# Patient Record
Sex: Female | Born: 1980 | Hispanic: No | Smoking: Never smoker
Health system: Southern US, Community
[De-identification: ages and names within clinical notes are randomized; demographics above are authoritative.]

## PROBLEM LIST (undated history)

## (undated) DIAGNOSIS — R002 Palpitations: Secondary | ICD-10-CM

## (undated) HISTORY — PX: WISDOM TOOTH EXTRACTION: SHX21

## (undated) HISTORY — DX: Palpitations: R00.2

## (undated) HISTORY — PX: MOUTH SURGERY: SHX715

---

## 2002-10-15 ENCOUNTER — Other Ambulatory Visit: Admission: RE | Admit: 2002-10-15 | Discharge: 2002-10-15 | Payer: Self-pay | Admitting: Obstetrics and Gynecology

## 2004-02-04 ENCOUNTER — Other Ambulatory Visit: Admission: RE | Admit: 2004-02-04 | Discharge: 2004-02-04 | Payer: Self-pay | Admitting: Obstetrics and Gynecology

## 2010-09-14 ENCOUNTER — Emergency Department (HOSPITAL_BASED_OUTPATIENT_CLINIC_OR_DEPARTMENT_OTHER)
Admission: EM | Admit: 2010-09-14 | Discharge: 2010-09-14 | Disposition: A | Discharge status: Home / Self Care | Payer: PRIVATE HEALTH INSURANCE | Attending: Emergency Medicine | Admitting: Emergency Medicine

## 2010-09-14 ENCOUNTER — Emergency Department (INDEPENDENT_AMBULATORY_CARE_PROVIDER_SITE_OTHER): Payer: PRIVATE HEALTH INSURANCE

## 2010-09-14 DIAGNOSIS — W19XXXA Unspecified fall, initial encounter: Secondary | ICD-10-CM | POA: Insufficient documentation

## 2010-09-14 DIAGNOSIS — R110 Nausea: Secondary | ICD-10-CM

## 2010-09-14 DIAGNOSIS — R51 Headache: Secondary | ICD-10-CM

## 2010-09-14 DIAGNOSIS — F1010 Alcohol abuse, uncomplicated: Secondary | ICD-10-CM | POA: Insufficient documentation

## 2010-09-14 DIAGNOSIS — R55 Syncope and collapse: Secondary | ICD-10-CM

## 2010-09-14 DIAGNOSIS — Y92009 Unspecified place in unspecified non-institutional (private) residence as the place of occurrence of the external cause: Secondary | ICD-10-CM | POA: Insufficient documentation

## 2010-09-14 DIAGNOSIS — S060X9A Concussion with loss of consciousness of unspecified duration, initial encounter: Secondary | ICD-10-CM | POA: Insufficient documentation

## 2010-09-14 LAB — DIFFERENTIAL
Basophils Absolute: 0 10*3/uL (ref 0.0–0.1)
Eosinophils Absolute: 0.2 10*3/uL (ref 0.0–0.7)
Eosinophils Relative: 5 % (ref 0–5)
Lymphocytes Relative: 36 % (ref 12–46)
Lymphs Abs: 1.7 10*3/uL (ref 0.7–4.0)
Monocytes Absolute: 0.4 10*3/uL (ref 0.1–1.0)

## 2010-09-14 LAB — PREGNANCY, URINE: Preg Test, Ur: NEGATIVE

## 2010-09-14 LAB — BASIC METABOLIC PANEL
BUN: 8 mg/dL (ref 6–23)
Calcium: 9.1 mg/dL (ref 8.4–10.5)
Creatinine, Ser: 0.8 mg/dL (ref 0.4–1.2)
GFR calc non Af Amer: >60 mL/min (ref >60)
Glucose, Bld: 83 mg/dL (ref 70–99)
Potassium: 4.4 mEq/L (ref 3.5–5.1)

## 2010-09-14 LAB — CBC
HCT: 35.4 % — ABNORMAL LOW (ref 36.0–46.0)
MCHC: 34.5 g/dL (ref 30.0–36.0)
MCV: 92.2 fL (ref 78.0–100.0)
Platelets: 261 10*3/uL (ref 150–400)
RDW: 12.1 % (ref 11.5–15.5)
WBC: 4.7 10*3/uL (ref 4.0–10.5)

## 2011-01-12 DIAGNOSIS — J45909 Unspecified asthma, uncomplicated: Secondary | ICD-10-CM | POA: Insufficient documentation

## 2012-04-12 IMAGING — CT CT HEAD W/O CM
1 series · 16 of 30 positions shown, 20 images · non-contrast
Comparison: 

CLINICAL DATA: Syncopal episode resulting in a fall and hitting
her head 2 days ago in the left frontal region.  Nausea.
Photosensitivity.  Headache.

CT HEAD WITHOUT CONTRAST
TECHNIQUE: Contiguous axial images were obtained from the base of
the skull through the vertex without contrast.

[Series 2: head 4.8 h37s · axial · 0.44mm/px · z∈[+1168,+1301]mm · 16 of 32 slices shown, 20 images]
[im 2/32  brain]
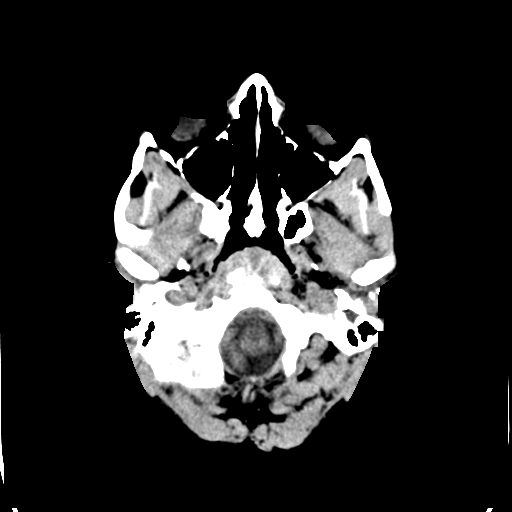
[im 2/32  bone]
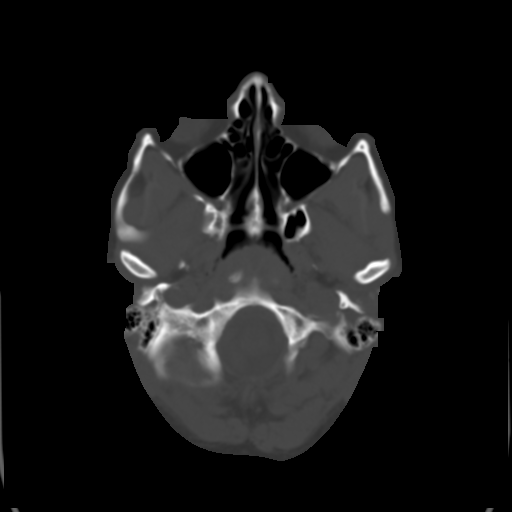
[im 4/32  brain]
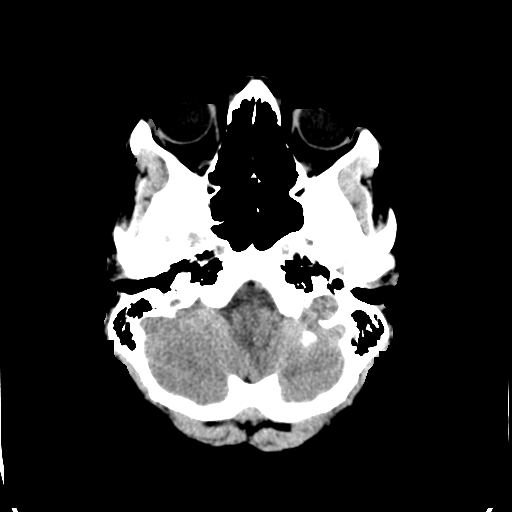
[im 6/32  brain]
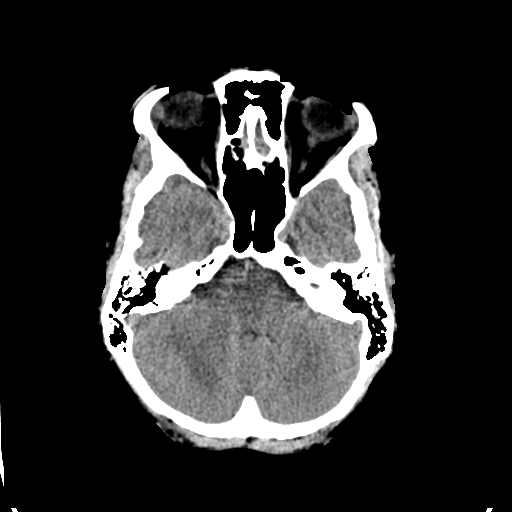
[im 8/32  brain]
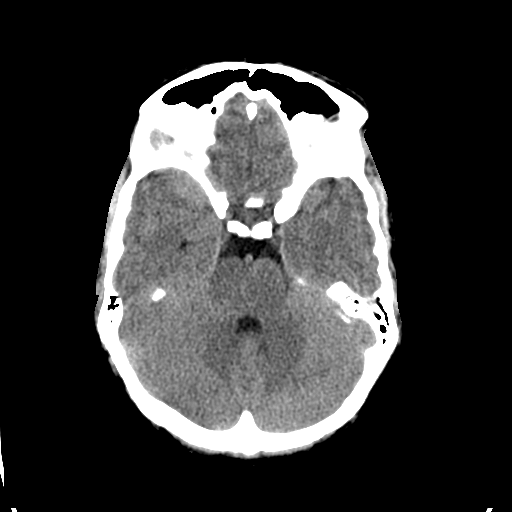
[im 9/32  brain]
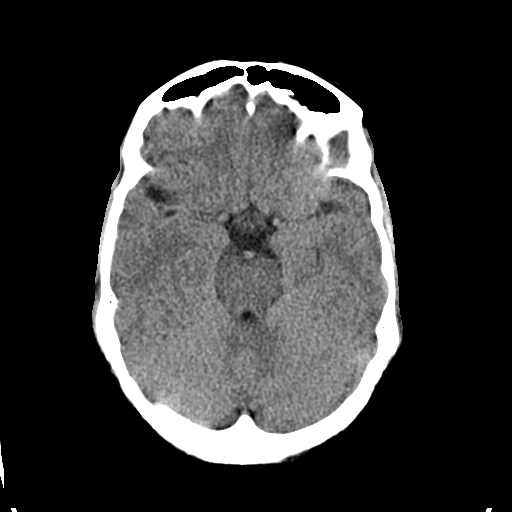
[im 9/32  bone]
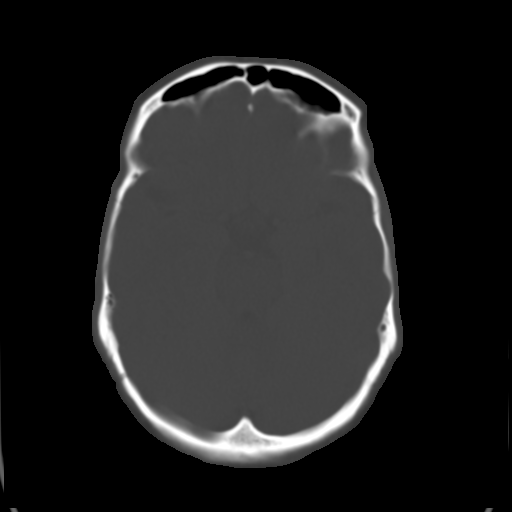
[im 11/32  brain]
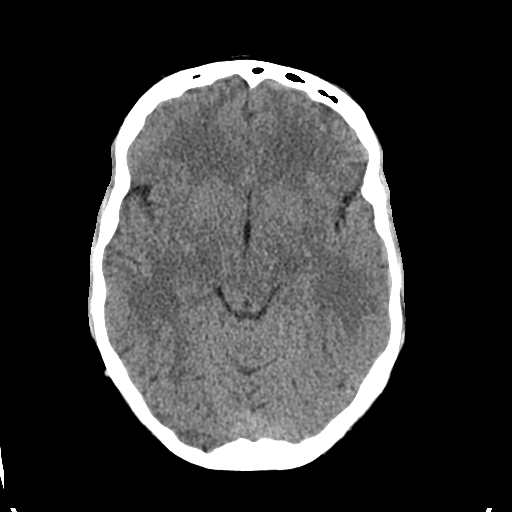
[im 13/32  brain]
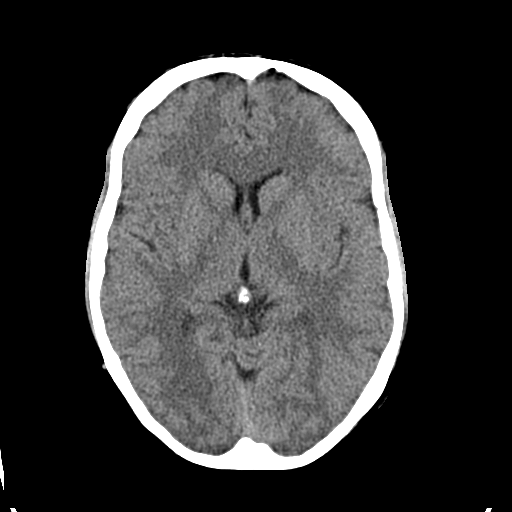
[im 15/32  brain]
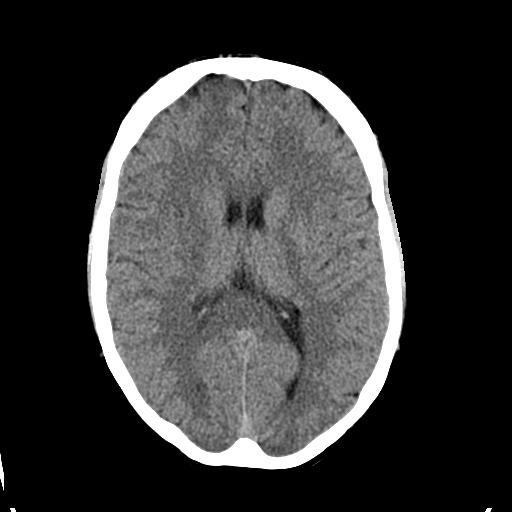
[im 17/32  brain]
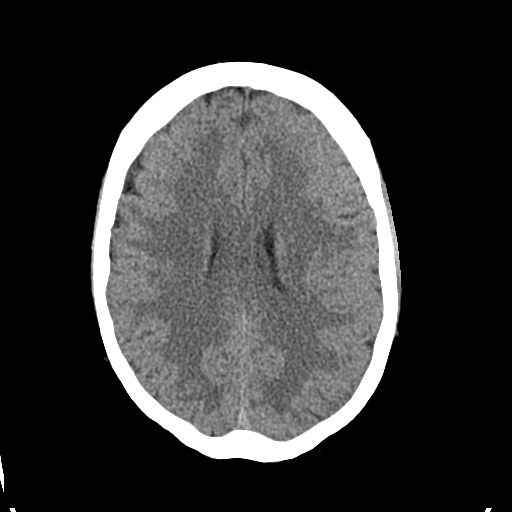
[im 17/32  bone]
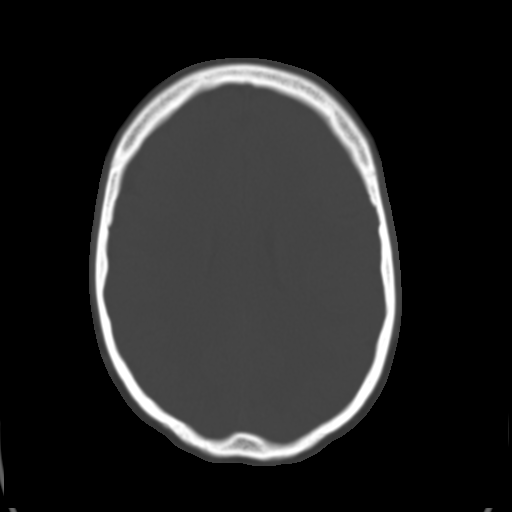
[im 19/32  brain]
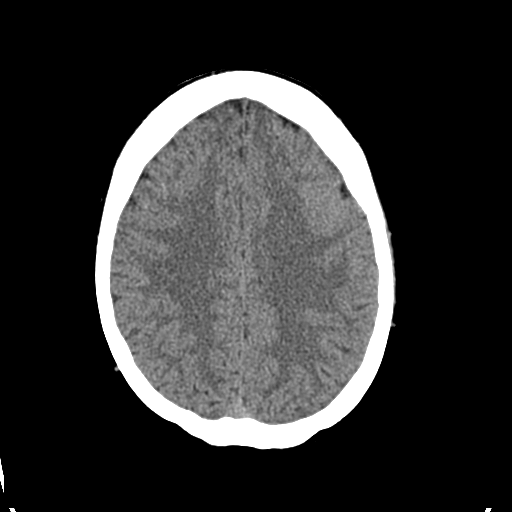
[im 21/32  brain]
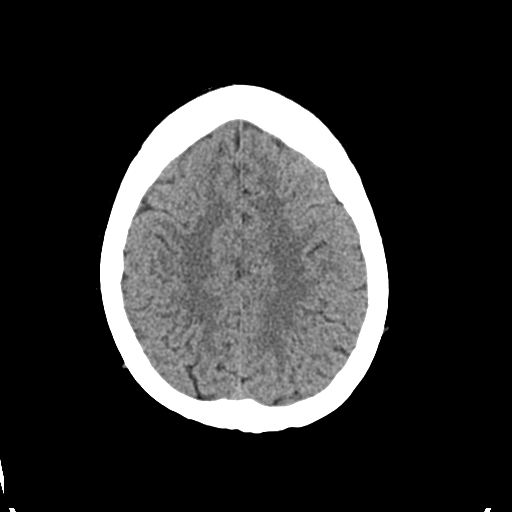
[im 23/32  brain]
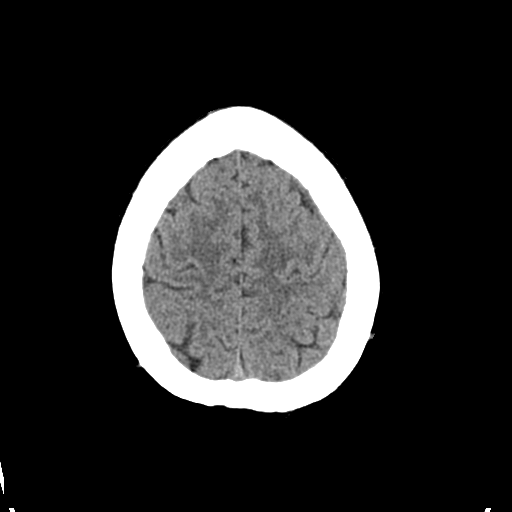
[im 24/32  brain]
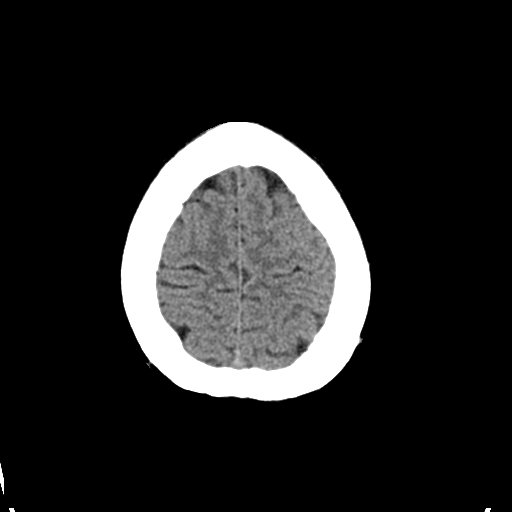
[im 24/32  bone]
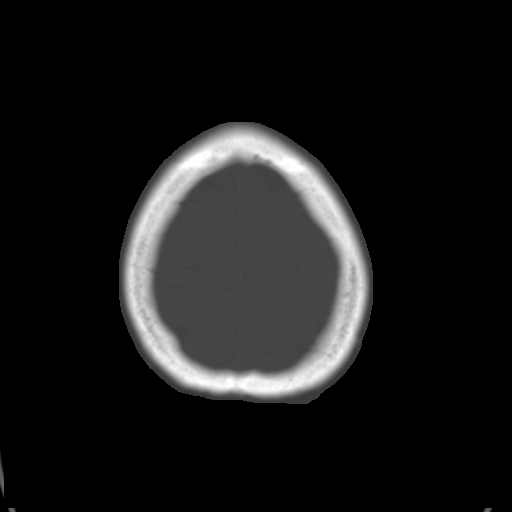
[im 26/32  brain]
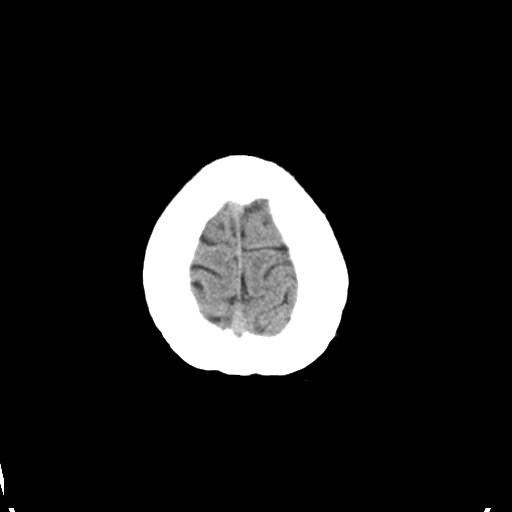
[im 28/32  brain]
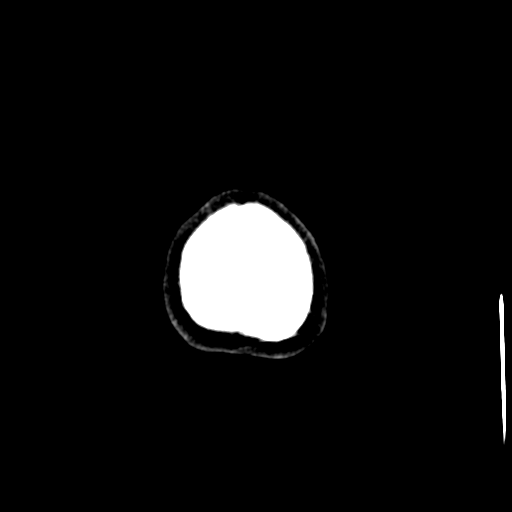
[im 30/32  brain]
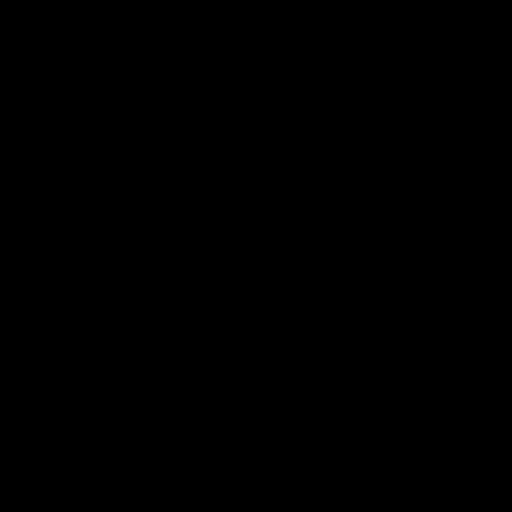

[16 of 30 positions shown; findings below may reference images not displayed]

FINDINGS: Normal appearing cerebral hemispheres and posterior
fossa structures.  Normal size and position of the ventricles.  No
skull fracture, intracranial hemorrhage or paranasal sinus
air/fluid levels.
IMPRESSION: Normal examination.

## 2016-10-11 DIAGNOSIS — R002 Palpitations: Secondary | ICD-10-CM | POA: Insufficient documentation

## 2016-10-13 ENCOUNTER — Encounter (INDEPENDENT_AMBULATORY_CARE_PROVIDER_SITE_OTHER): Payer: Self-pay

## 2016-10-13 ENCOUNTER — Ambulatory Visit (INDEPENDENT_AMBULATORY_CARE_PROVIDER_SITE_OTHER): Payer: BLUE CROSS/BLUE SHIELD | Admitting: Cardiology

## 2016-10-13 VITALS — BP 116/80 | HR 80 | Ht 62.0 in | Wt 189.4 lb

## 2016-10-13 DIAGNOSIS — R002 Palpitations: Secondary | ICD-10-CM

## 2016-10-13 NOTE — Patient Instructions (Signed)
Medication Instructions:  The current medical regimen is effective;  continue present plan and medications.  Follow-Up: Follow up as needed.  Thank you for choosing [facility]!!     

## 2016-10-13 NOTE — Progress Notes (Signed)
Cardiology Office Note:    Date:  10/13/2016   ID:  Kathleen Madden, DOB 01/07/1981, MRN [id number]  PCP:  No PCP Per Patient  Cardiologist:  Donato SchultzMark Addie Cederberg, MD    Referring MD: Claudell KyleMcMillian, Traci, MD     History of Present Illness:    Kathleen Madden is a 36 y.o. female here for evaluation of palpitations at the request of [employer] [facility], Dr. Claudell Kyleraci McMillian  She has been noting palpitations off and on over the past 3 weeks. They are not happening every day but most days are present. Sometimes can feel a sensation like a flutter in her chest. Denies any chest pain, shortness of breath, dizziness, syncope. 3-4 times a day, but not in last 2 days. Flip flop feeling. Different times of day. Notes it after sitting down in car for instance.   Started exercising more 2 moths ago, Twitching in feet, ribs legs. Coffee, no change.   Labs were unremarkable. Hemoglobin 13 potassium 4.5, creatinine 0.75 TSH 2.59  Past Medical History:  Diagnosis Date  . Palpitations     Past Surgical History:  Procedure Laterality Date  . MOUTH SURGERY    . WISDOM TOOTH EXTRACTION      Current Medications: Current Meds  Medication Sig  . Albuterol Sulfate 108 (90 Base) MCG/ACT AEPB Inhale into the lungs.  . Multiple Vitamin (MULTIVITAMIN) tablet Take 1 tablet by mouth daily.     Allergies:   Amoxicillin   Social History   Social History  . Marital status: Unknown    Spouse name: N/A  . Number of children: N/A  . Years of education: N/A   Social History Main Topics  . Smoking status: Never Smoker  . Smokeless tobacco: Never Used  . Alcohol use Yes     Comment: once a week  . Drug use: No  . Sexual activity: Yes    Partners: Male   Other Topics Concern  . None   Social History Narrative  . None     Family History: The patient's family history includes Healthy in her brother, father, and maternal grandmother; Heart Problems in her maternal grandfather; Hypothyroidism  in her mother. ROS:   Please see the history of present illness.     All other systems reviewed and are negative.  EKGs/Labs/Other Studies Reviewed:    The following studies were reviewed today: Prior labs, office note, EKG reviewed  EKG:  09/30/16 shows sinus rhythm poor R-wave progression, no other abnormalities, vertical axis. EKG from today 10/13/16 shows sinus rhythm vertical axis no other abnormalities. No change from prior. Personally viewed.  Recent Labs: No results found for requested labs within last 8760 hours.   Recent Lipid Panel No results found for: CHOL, TRIG, HDL, CHOLHDL, VLDL, LDLCALC, LDLDIRECT  Physical Exam:    VS:  BP 116/80   Pulse 80   Ht 5\' 2"  (1.575 m)   Wt 189 lb 6.4 oz (85.9 kg)   BMI 34.64 kg/m     Wt Readings from Last 3 Encounters:  10/13/16 189 lb 6.4 oz (85.9 kg)     GEN:  Well nourished, well developed in no acute distress HEENT: Normal NECK: No JVD; No carotid bruits LYMPHATICS: No lymphadenopathy CARDIAC: RRR, no murmurs, rubs, gallops RESPIRATORY:  Clear to auscultation without rales, wheezing or rhonchi  ABDOMEN: Soft, non-tender, non-distended MUSCULOSKELETAL:  No edema; No deformity  SKIN: Warm and dry NEUROLOGIC:  Alert and oriented x 3 PSYCHIATRIC:  Normal affect  ASSESSMENT:    1. Palpitations   2. Palpitation    PLAN:    In order of problems listed above:  Palpitations  - Likely PVCs or PACs. No high-risk symptoms such as syncope. Periodic, intermittent. Electrolytes were normal, thyroid normal. I do not appreciate any murmurs. EKG unremarkable. Reassurance. Try to avoid excessive caffeine, stimulants.  - If symptoms worsen or become more worrisome, further testing can be ordered such as Holter monitor or perhaps echocardiogram. I reassured her however PVCs are benign. She has been going under increased stress finishing up her nutrition program at [facility].   Medication Adjustments/Labs and Tests Ordered: Current  medicines are reviewed at length with the patient today.  Concerns regarding medicines are outlined above. Labs and tests ordered and medication changes are outlined in the patient instructions below:  Patient Instructions  Medication Instructions:  The current medical regimen is effective;  continue present plan and medications.  Follow-Up: Follow up as needed.  Thank you for choosing [facility]!!       Signed, Donato Schultz, MD  10/13/2016 3:12 PM    [facility]

## 2017-06-17 ENCOUNTER — Ambulatory Visit: Payer: Self-pay | Admitting: Family Medicine

## 2017-06-22 ENCOUNTER — Ambulatory Visit: Payer: BLUE CROSS/BLUE SHIELD | Admitting: Internal Medicine

## 2018-07-08 ENCOUNTER — Emergency Department
Admission: EM | Admit: 2018-07-08 | Discharge: 2018-07-08 | Disposition: A | Discharge status: Home / Self Care | Payer: BLUE CROSS/BLUE SHIELD | Attending: Emergency Medicine | Admitting: Emergency Medicine

## 2018-07-08 ENCOUNTER — Encounter: Payer: Self-pay | Admitting: Emergency Medicine

## 2018-07-08 ENCOUNTER — Other Ambulatory Visit: Payer: Self-pay

## 2018-07-08 DIAGNOSIS — R6889 Other general symptoms and signs: Secondary | ICD-10-CM

## 2018-07-08 DIAGNOSIS — Z79899 Other long term (current) drug therapy: Secondary | ICD-10-CM | POA: Insufficient documentation

## 2018-07-08 DIAGNOSIS — M542 Cervicalgia: Secondary | ICD-10-CM | POA: Insufficient documentation

## 2018-07-08 DIAGNOSIS — R410 Disorientation, unspecified: Secondary | ICD-10-CM | POA: Insufficient documentation

## 2018-07-08 DIAGNOSIS — R110 Nausea: Secondary | ICD-10-CM | POA: Insufficient documentation

## 2018-07-08 DIAGNOSIS — R42 Dizziness and giddiness: Secondary | ICD-10-CM

## 2018-07-08 NOTE — ED Provider Notes (Signed)
[facility] Emergency Department Provider Note ____________________________________________  Time seen: 1745  I have reviewed the triage vital signs and the nursing notes.  HISTORY  Chief Complaint  Neck Pain   HPI Kathleen Madden is a 38 y.o. female presents to the ER today with complaint of intermittent pulsatile feeling in the left neck.  She reports this started about 1 week ago.  She has no symptoms on the right side.  She denies neck pain or decreased range of motion.  She denies headache or visual changes.  She reports the sensation will last for 1 to 2 seconds and then resolve.  She reports this sensation does radiate into the left side of her jaw.  She reports tingling sensation into the base of the left side of her scalp.  She denies ear pain, runny nose or sore throat.  She denies left arm pain.  This sensation can occur mostly with exertion but also with rest.  She denies any injury to the neck.  She also reports episode at the grocery store today of acute onset nausea, dizziness and mild confusion.  She reports this lasted for 15 minutes.  She has intermittent issues with low blood sugar but reports this did not feel the same.  She has a history of migraines and reports this felt very similar.  She reports she is back to her baseline at this time.  She has not taken anything over-the-counter for her symptoms.  Past Medical History:  Diagnosis Date  . Palpitations     Patient Active Problem List   Diagnosis Date Noted  . Palpitation 10/11/2016    Past Surgical History:  Procedure Laterality Date  . MOUTH SURGERY    . WISDOM TOOTH EXTRACTION      Prior to Admission medications   Medication Sig Start Date End Date Taking? Authorizing Provider  Albuterol Sulfate 108 (90 Base) MCG/ACT AEPB Inhale into the lungs.    [provider]  Multiple Vitamin (MULTIVITAMIN) tablet Take 1 tablet by mouth daily.    [provider]     Allergies Amoxicillin  Family History  Problem Relation Age of Onset  . Hypothyroidism Mother   . Healthy Father   . Healthy Brother   . Healthy Maternal Grandmother   . Heart Problems Maternal Grandfather     Social History Social History   Tobacco Use  . Smoking status: Never Smoker  . Smokeless tobacco: Never Used  Substance Use Topics  . Alcohol use: Yes    Comment: once a week  . Drug use: No    Review of Systems  Constitutional: Negative for fever, chills or body aches. Eyes: Negative for visual changes. ENT: Negative for ear pain or sore throat. Cardiovascular: Negative for chest pain. Respiratory: Negative for cough or shortness of breath. Gastrointestinal: Positive for nausea, negative for abdominal pain, vomiting and diarrhea. Musculoskeletal: Positive for pulsatile sensation of left neck.  Negative for neck pain. Skin: Negative for rash. Neurological: Positive for tingling in the base of the left side of the scalp.  Negative for headaches, focal weakness or numbness. ____________________________________________  PHYSICAL EXAM:  VITAL SIGNS: ED Triage Vitals  Enc Vitals Group     BP 07/08/18 1718 (!) 144/93     Pulse Rate 07/08/18 1717 96     Resp 07/08/18 1717 18     Temp 07/08/18 1717 97.8 F (36.6 C)     Temp Source 07/08/18 1717 Oral     SpO2 07/08/18 1717  100 %     Weight --      Height 07/08/18 1719 5\' 2"  (1.575 m)     Head Circumference --      Peak Flow --      Pain Score 07/08/18 1719 0     Pain Loc --      Pain Edu? --      Excl. in [place]? --     Constitutional: Alert and oriented. Well appearing and in no distress. Head: Normocephalic and atraumatic, no sinus tenderness noted.. Eyes: Conjunctivae are normal. PERRL. Normal extraocular movements Ears: Canals clear. TMs intact bilaterally. Mouth/Throat: Mucous membranes are moist. Neck: Supple. No thyromegaly. Hematological/Lymphatic/Immunological: No cervical  lymphadenopathy. Cardiovascular: Normal rate, regular rhythm.  No carotid bruit noted Respiratory: Normal respiratory effort. No wheezes/rales/rhonchi. Gastrointestinal: Soft and nontender. No distention. Musculoskeletal: Normal flexion, extension and rotation of the cervical spine.  No bony tenderness noted over the cervical spine.  Normal internal and external rotation of the left shoulder.  No pain with palpation of the left shoulder. Neurologic:  Normal gait without ataxia. Normal speech and language. No gross focal neurologic deficits are appreciated. Skin:  Skin is warm, dry and intact. No rash noted.    INITIAL IMPRESSION / ASSESSMENT AND PLAN / ED COURSE  Abnormal Sensation of Left Side of Neck, Nausea, Dizziness, Confusion:  Exam benign Offered CT scan of head and cervical spine, but she declines at this time (She is feeling well and back to baseline at this time) Limited risk factors for carotid atherosclerosis, TIA No indication for lab workup Advised her to monitor symptoms for now, write down when this abnormal sensation happens, how long it lasts, associated s/s at this time Red flags/ER precautions discussed  ____________________________________________  FINAL CLINICAL IMPRESSION(S) / ED DIAGNOSES  Final diagnoses:  Neck symptom  Nausea  Dizziness  Confusion   Nicki Reaper, NP    Lorre Munroe, NP 07/08/18 1757    Nita Sickle, MD 07/09/18 1535

## 2018-07-08 NOTE — ED Triage Notes (Signed)
Pt arrives with concerns over intermittent pain/discomfort to the left side of pt's neck. Pt states the discomfort is present with exertion (eliptical). Pt denies known injury. Pt denies any current pain in triage. Full ROM of neck

## 2018-07-08 NOTE — Discharge Instructions (Addendum)
You were seen today for abnormal sensation of the left neck, nausea, dizziness and transient confusion. Exam was benign. Continue to monitor symptoms, write down when they occur, associated symptoms. If persistent or worsening, follow up here or when you get a PCP

## 2019-10-25 ENCOUNTER — Emergency Department: Payer: Self-pay

## 2019-10-25 ENCOUNTER — Encounter: Payer: Self-pay | Admitting: Emergency Medicine

## 2019-10-25 ENCOUNTER — Other Ambulatory Visit: Payer: Self-pay

## 2019-10-25 ENCOUNTER — Emergency Department
Admission: EM | Admit: 2019-10-25 | Discharge: 2019-10-25 | Disposition: A | Discharge status: Home / Self Care | Payer: Self-pay | Attending: Emergency Medicine | Admitting: Emergency Medicine

## 2019-10-25 DIAGNOSIS — R0789 Other chest pain: Secondary | ICD-10-CM

## 2019-10-25 DIAGNOSIS — Z79899 Other long term (current) drug therapy: Secondary | ICD-10-CM | POA: Insufficient documentation

## 2019-10-25 DIAGNOSIS — Y9389 Activity, other specified: Secondary | ICD-10-CM | POA: Insufficient documentation

## 2019-10-25 DIAGNOSIS — X509XXA Other and unspecified overexertion or strenuous movements or postures, initial encounter: Secondary | ICD-10-CM | POA: Insufficient documentation

## 2019-10-25 DIAGNOSIS — R000 Tachycardia, unspecified: Secondary | ICD-10-CM | POA: Insufficient documentation

## 2019-10-25 DIAGNOSIS — S29011A Strain of muscle and tendon of front wall of thorax, initial encounter: Secondary | ICD-10-CM | POA: Insufficient documentation

## 2019-10-25 DIAGNOSIS — Y999 Unspecified external cause status: Secondary | ICD-10-CM | POA: Insufficient documentation

## 2019-10-25 DIAGNOSIS — R1011 Right upper quadrant pain: Secondary | ICD-10-CM | POA: Insufficient documentation

## 2019-10-25 DIAGNOSIS — Y92810 Car as the place of occurrence of the external cause: Secondary | ICD-10-CM | POA: Insufficient documentation

## 2019-10-25 LAB — HEPATIC FUNCTION PANEL
ALT: 12 U/L (ref 0–44)
AST: 15 U/L (ref 15–41)
Albumin: 4.4 g/dL (ref 3.5–5.0)
Alkaline Phosphatase: 40 U/L (ref 38–126)
Bilirubin, Direct: <0.1 mg/dL (ref 0.0–0.2)
Total Bilirubin: 0.6 mg/dL (ref 0.3–1.2)
Total Protein: 7.4 g/dL (ref 6.5–8.1)

## 2019-10-25 LAB — BASIC METABOLIC PANEL
Anion gap: 7 (ref 5–15)
BUN: 14 mg/dL (ref 6–20)
CO2: 24 mmol/L (ref 22–32)
Calcium: 9.3 mg/dL (ref 8.9–10.3)
Chloride: 106 mmol/L (ref 98–111)
Creatinine, Ser: 0.62 mg/dL (ref 0.44–1.00)
GFR calc Af Amer: >60 mL/min (ref >60)
GFR calc non Af Amer: >60 mL/min (ref >60)
Glucose, Bld: 119 mg/dL — ABNORMAL HIGH (ref 70–99)
Potassium: 4.2 mmol/L (ref 3.5–5.1)
Sodium: 137 mmol/L (ref 135–145)

## 2019-10-25 LAB — CBC
HCT: 40.9 % (ref 36.0–46.0)
Hemoglobin: 14.2 g/dL (ref 12.0–15.0)
MCH: 33.1 pg (ref 26.0–34.0)
MCHC: 34.7 g/dL (ref 30.0–36.0)
MCV: 95.3 fL (ref 80.0–100.0)
Platelets: 239 10*3/uL (ref 150–400)
RBC: 4.29 MIL/uL (ref 3.87–5.11)
RDW: 12.2 % (ref 11.5–15.5)
WBC: 6.2 10*3/uL (ref 4.0–10.5)
nRBC: 0 % (ref 0.0–0.2)

## 2019-10-25 LAB — TROPONIN I (HIGH SENSITIVITY): Troponin I (High Sensitivity): <2 ng/L (ref <18)

## 2019-10-25 LAB — LIPASE, BLOOD: Lipase: 25 U/L (ref 11–51)

## 2019-10-25 MED ORDER — SODIUM CHLORIDE 0.9% FLUSH: 3.0000 mL | Freq: Once | INTRAVENOUS | Status: DC

## 2019-10-25 NOTE — ED Provider Notes (Signed)
[facility] Emergency Department Provider Note  ____________________________________________  Time seen: Approximately 9:58 AM  I have reviewed the triage vital signs and the nursing notes.   HISTORY  Chief Complaint Chest Pain    HPI Kathleen Madden is a 39 y.o. female with no significant past medical history who comes the ED complaining of right lateral chest pain that felt hot, sudden onset worse with movement, no alleviating factors, gradually improving since onset earlier this morning a few hours ago.  Nonradiating.  Not pleuritic, not exertional.  No cough fevers chills or shortness of breath.      Past Medical History:  Diagnosis Date  . Palpitations      Patient Active Problem List   Diagnosis Date Noted  . Palpitation 10/11/2016     Past Surgical History:  Procedure Laterality Date  . MOUTH SURGERY    . WISDOM TOOTH EXTRACTION       Prior to Admission medications   Medication Sig Start Date End Date Taking? Authorizing Provider  Albuterol Sulfate 108 (90 Base) MCG/ACT AEPB Inhale into the lungs.    [provider]  Multiple Vitamin (MULTIVITAMIN) tablet Take 1 tablet by mouth daily.    [provider]     Allergies Amoxicillin   Family History  Problem Relation Age of Onset  . Hypothyroidism Mother   . Healthy Father   . Healthy Brother   . Healthy Maternal Grandmother   . Heart Problems Maternal Grandfather     Social History Social History   Tobacco Use  . Smoking status: Never Smoker  . Smokeless tobacco: Never Used  Substance Use Topics  . Alcohol use: Yes    Comment: once a week  . Drug use: No    Review of Systems  Constitutional:   No fever or chills.  ENT:   No sore throat. No rhinorrhea. Cardiovascular:   Positive right-sided chest pain as above without palpitations or syncope. Respiratory:   No dyspnea or cough. Gastrointestinal:   Negative for abdominal pain, vomiting and  diarrhea.  Musculoskeletal:   Negative for focal pain or swelling All other systems reviewed and are negative except as documented above in ROS and HPI.  ____________________________________________   PHYSICAL EXAM:  VITAL SIGNS: ED Triage Vitals  Enc Vitals Group     BP 10/25/19 0722 (!) 142/85     Pulse Rate 10/25/19 0722 (!) 109     Resp 10/25/19 0722 18     Temp 10/25/19 0722 98.8 F (37.1 C)     Temp Source 10/25/19 0722 Oral     SpO2 10/25/19 0722 100 %     Weight --      Height --      Head Circumference --      Peak Flow --      Pain Score 10/25/19 0719 6     Pain Loc --      Pain Edu? --      Excl. in [place]? --     Vital signs reviewed, nursing assessments reviewed.   Constitutional:   Alert and oriented. Non-toxic appearance. Eyes:   Conjunctivae are normal. EOMI. PERRL. ENT      Head:   Normocephalic and atraumatic.      Nose:   Wearing a mask.      Mouth/Throat:   Wearing a mask.      Neck:   No meningismus. Full ROM. Hematological/Lymphatic/Immunilogical:   No cervical lymphadenopathy. Cardiovascular:   RRR, heart rate  80. Symmetric bilateral radial and DP pulses.  No murmurs. Cap refill less than 2 seconds. Respiratory:   Normal respiratory effort without tachypnea/retractions. Breath sounds are clear and equal bilaterally. No wheezes/rales/rhonchi. Gastrointestinal:   Soft with slight right upper quadrant tenderness. Non distended. There is no CVA tenderness.  No rebound, rigidity, or guarding. Musculoskeletal:   Normal range of motion in all extremities. No joint effusions.  No lower extremity tenderness.  No edema.  Chest pain not reproducible on exam Neurologic:   Normal speech and language.  Motor grossly intact. No acute focal neurologic deficits are appreciated.  Skin:    Skin is warm, dry and intact. No rash noted.  No petechiae, purpura, or bullae.  ____________________________________________    LABS (pertinent positives/negatives) (all labs  ordered are listed, but only abnormal results are displayed) Labs Reviewed  BASIC METABOLIC PANEL - Abnormal; Notable for the following components:      Result Value   Glucose, Bld 119 (*)    All other components within normal limits  CBC  HEPATIC FUNCTION PANEL  LIPASE, BLOOD  POC URINE PREG, ED  TROPONIN I (HIGH SENSITIVITY)  TROPONIN I (HIGH SENSITIVITY)   ____________________________________________   EKG  Interpreted by me Sinus tachycardia rate 103.  Right axis.  Normal intervals.  Normal QRS ST segments and T waves.  No acute ischemic changes, not suggestive of right heart strain.  ____________________________________________    RADIOLOGY  DG Chest 2 View  Result Date: 10/25/2019 CLINICAL DATA:  Right-sided chest pain, sudden onset this morning. EXAM: CHEST - 2 VIEW COMPARISON:  09/14/2010 FINDINGS: Heart size is normal. Mediastinal shadows are normal. The lungs are clear. No bronchial thickening. No infiltrate, mass, effusion or collapse. Pulmonary vascularity is normal. Mild chronic thoracic spinal curvature. IMPRESSION: No active disease. No pneumothorax. Mild chronic thoracic spinal curvature. Electronically Signed   By: Paulina Fusi M.D.   On: 10/25/2019 08:02    ____________________________________________   PROCEDURES Procedures  ____________________________________________  DIFFERENTIAL DIAGNOSIS   Spontaneous pneumothorax, cholelithiasis, chest wall muscle strain  CLINICAL IMPRESSION / ASSESSMENT AND PLAN / ED COURSE  Medications ordered in the ED: Medications  sodium chloride flush (NS) 0.9 % injection 3 mL (has no administration in time range)    Pertinent labs & imaging results that were available during my care of the patient were reviewed by me and considered in my medical decision making (see chart for details).  Kathleen Madden was evaluated in Emergency Department on 10/25/2019 for the symptoms described in the history of present illness.  She was evaluated in the context of the global COVID-19 pandemic, which necessitated consideration that the patient might be at risk for infection with the SARS-CoV-2 virus that causes COVID-19. Institutional protocols and algorithms that pertain to the evaluation of patients at risk for COVID-19 are in a state of rapid change based on information released by regulatory bodies including the CDC and federal and state organizations. These policies and algorithms were followed during the patient's care in the ED.   Patient presents with sudden onset of right-sided chest pain which was initially severe and has now mostly resolved.  Not pleuritic.  No evidence of VTE.  Considering the patient's symptoms, medical history, and physical examination today, I have low suspicion for ACS, PE, TAD, pneumothorax, carditis, mediastinitis, pneumonia, CHF, or sepsis.  At triage patient had tachycardia which is now resolved.  Other labs are reassuring.  Troponin was sent as part of triage protocol but not indicated based  on the current presentation, no further cardiac work-up is warranted.  Chest x-ray negative for pneumothorax.  I will add on LFTs to look for signs of biliary disease, if negative, patient can be discharged home to use heat therapy, NSAIDs and follow-up with primary care.      ____________________________________________   FINAL CLINICAL IMPRESSION(S) / ED DIAGNOSES    Final diagnoses:  Chest wall muscle strain, initial encounter  Atypical chest pain     ED Discharge Orders    None      Portions of this note were generated with dragon dictation software. Dictation errors may occur despite best attempts at proofreading.   Sharman Cheek, MD 10/25/19 1008

## 2019-10-25 NOTE — ED Notes (Signed)
Called lab to check on add on labs from hour ago. Was put through to [facility] in Chemistry and the labs were never added on. He has now pulled them and will run them asap.  Will inform MD Scotty Court regarding delay

## 2019-10-25 NOTE — ED Notes (Addendum)
Called and spoke with [facility] in lab and they will add lipase and hepatic function panel onto blood already in lab.

## 2019-10-25 NOTE — ED Triage Notes (Signed)
Pt presents to ED via POV with c/o sudden onset R sided chest pain that started this morning while reaching for something in the car, pt states feels like she has a "hot ball" under R ribcage, pain worse with movement or position changes.

## 2020-02-06 DIAGNOSIS — N852 Hypertrophy of uterus: Secondary | ICD-10-CM | POA: Insufficient documentation

## 2020-02-06 DIAGNOSIS — D219 Benign neoplasm of connective and other soft tissue, unspecified: Secondary | ICD-10-CM | POA: Insufficient documentation

## 2022-04-12 ENCOUNTER — Ambulatory Visit
Admission: RE | Admit: 2022-04-12 | Discharge: 2022-04-12 | Disposition: A | Discharge status: Home / Self Care | Payer: Managed Care, Other (non HMO) | Source: Ambulatory Visit

## 2022-08-05 DIAGNOSIS — R102 Pelvic and perineal pain unspecified side: Secondary | ICD-10-CM | POA: Insufficient documentation

## 2022-08-10 DIAGNOSIS — N83202 Unspecified ovarian cyst, left side: Secondary | ICD-10-CM | POA: Insufficient documentation

## 2024-01-18 DIAGNOSIS — K602 Anal fissure, unspecified: Secondary | ICD-10-CM | POA: Insufficient documentation

## 2024-02-27 DIAGNOSIS — Z01419 Encounter for gynecological examination (general) (routine) without abnormal findings: Secondary | ICD-10-CM | POA: Insufficient documentation

## 2024-03-21 ENCOUNTER — Ambulatory Visit (INDEPENDENT_AMBULATORY_CARE_PROVIDER_SITE_OTHER): Admitting: Family Medicine

## 2024-03-21 ENCOUNTER — Encounter: Payer: Self-pay | Admitting: Family Medicine

## 2024-03-21 VITALS — BP 110/66 | HR 105 | Ht 62.0 in | Wt 214.4 lb

## 2024-03-21 DIAGNOSIS — S76011A Strain of muscle, fascia and tendon of right hip, initial encounter: Secondary | ICD-10-CM | POA: Diagnosis not present

## 2024-03-21 DIAGNOSIS — S76811A Strain of other specified muscles, fascia and tendons at thigh level, right thigh, initial encounter: Secondary | ICD-10-CM | POA: Diagnosis not present

## 2024-03-21 MED ORDER — CELECOXIB 50 MG PO CAPS: 50.0000 mg | ORAL_CAPSULE | Freq: Two times a day (BID) | ORAL | 0 refills | Status: DC | PRN

## 2024-03-21 NOTE — Patient Instructions (Signed)
 VISIT SUMMARY:  You visited us  today due to right groin pain that started during a Pilates session and has been affecting your ability to exercise. We also discussed your chronic low back pain and gastrointestinal issues related to ibuprofen use.  YOUR PLAN:  RIGHT HIP FLEXOR AND/OR ILIOPSOAS STRAIN/TENDINOPATHY: You have a strain or tendinopathy in your right hip flexor or iliopsoas, likely due to Pilates exercises. The pain is more noticeable when your knees are bent and your pelvis is tucked. -Take Celebrex 50 mg, 1-2 tablets twice daily as needed with food for one week. Stop taking it if you experience gastrointestinal symptoms. -Attend physical therapy for targeted rehabilitation exercises. -Follow up in 8 weeks to check your progress. We may consider imaging if your symptoms persist.  CHRONIC LOW BACK PAIN WITH LEFT-SIDED RADICULAR SYMPTOMS: You have chronic low back pain with occasional tingling on the left side, which sometimes radiates to your left leg. -Monitor your symptoms and we will adjust your treatment as needed.  GASTROESOPHAGEAL REFLUX DISEASE (GERD): You have GERD symptoms that are worsened by ibuprofen use. We have chosen Celebrex for you because it has fewer gastrointestinal side effects. -Take Celebrex with food to minimize gastrointestinal side effects. -Stop taking Celebrex if your reflux symptoms get worse.

## 2024-03-21 NOTE — Progress Notes (Signed)
 Primary Care / Sports Medicine Office Visit  Patient Information:  Patient ID: Kathleen Madden, female DOB: [birth year] Age: 43 y.o. MRN: [id number]   Kathleen Madden is a pleasant 43 y.o. female presenting with the following:  Chief Complaint  Patient presents with   Groin Pain    Right groin pain since piliates class on 01/31/24. Patient has rested, took ibuprofen, and used heat to help relieve pain, but it has not helped. Patient having difficulty with lying down and doing toe taps, sneezing, and leaning backwards while standing.     Vitals:   03/21/24 0902  BP: 110/66  Pulse: (!) 105  SpO2: 98%   Vitals:   03/21/24 0902  Weight: 214 lb 6.4 oz (97.3 kg)  Height: 5' 2 (1.575 m)   Body mass index is 39.21 kg/m.  No results found.   Discussed the use of AI scribe software for clinical note transcription with the patient, who gave verbal consent to proceed.   Independent interpretation of notes and tests performed by another provider:   None  Procedures performed:   None  Pertinent History, Exam, Impression, and Recommendations:   Problem List Items Addressed This Visit     Strain of flexor muscle of right hip - Primary   History of Present Illness Kathleen Madden is a 43 year old female who presents with right groin pain following a Pilates session.  Right groin pain - Onset in August during a Pilates session, initially noticed during a 'teaser' move - Focal pain located in the right anterior groin - Pain intensified after using a Pilates chair and during a reformer session with knees in tabletop position - Pain is more pronounced with knees bent in tabletop position and lessens when legs are straightened - Sneezing initially caused excruciating pain, now improved but still requires curling in to minimize discomfort - No radiation of pain down the leg - No associated paresthesia or tingling in the right leg - Unable to participate in Pilates for two  months due to pain  Pain management and medication side effects - Intermittent use of heating pad for symptom relief - Ibuprofen 600 mg taken twice daily during pain flares - Ibuprofen provides some relief for hip pain - Significant gastrointestinal side effects from ibuprofen, including stomach discomfort and reflux, limiting its use  Chronic low back pain and paresthesia - Chronic lower back pain with occasional tingling, primarily on the left side - Back pain located in the lower back and side, sometimes radiating to the left leg  PHYSICAL EXAM - RIGHT HIP INSPECTION: Normal alignment without swelling, erythema, ecchymosis, or deformity. No skin changes noted. PALPATION: Non-tender at sacroiliac joint, greater trochanter, gluteus medius distribution, and piriformis. No crepitus or warmth. RANGE OF MOTION: Full and symmetric hip flexion, extension, abduction, and adduction. Internal and external rotation full and painless. STRENGTH: 5/5 strength with resisted hip flexion, abduction, and adduction. Minor anterior groin discomfort elicited with resisted iliopsoas testing. Resisted hip flexion with the knee in 90-degree flexion is benign. SPECIAL TESTS: Straight leg raise negative. FADIR, FABER, and piriformis tests negative. No reproduction of groin pain with provocative testing. NEUROVASCULAR: Grossly intact.  Assessment and Plan Right hip flexor and/or iliopsoas strain/tendinopathy Likely due to Pilates exercises. Pain focal to right groin, exacerbated by knee bending and pelvis tucking. Differential includes iliopsoas and rectus femoris tendon involvement. History and symptoms suggest strain or tendinopathy. - Prescribed Celebrex 50 mg, 1-2 tablets twice daily as needed with  food for one week. Discontinue if gastrointestinal symptoms occur. - Referred to physical therapy for targeted rehabilitation exercises. - Advised follow-up in 8 weeks to assess progress and consider imaging if  symptoms persist.  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 - Monitor symptoms and adjust treatment as needed.  Gastroesophageal reflux disease (GERD) GERD symptoms exacerbated by ibuprofen use. Celebrex chosen for lower gastrointestinal side effect profile. - Advise taking Celebrex with food to minimize gastrointestinal side effects. - Discontinue Celebrex if reflux symptoms worsen.      Relevant Medications   celecoxib (CELEBREX) 50 MG capsule   Other Relevant Orders   Ambulatory referral to Physical Therapy   Strain of right iliopsoas muscle   Relevant Medications   celecoxib (CELEBREX) 50 MG capsule   Other Relevant Orders   Ambulatory referral to Physical Therapy     Orders & Medications Medications:  Meds ordered this encounter  Medications   celecoxib (CELEBREX) 50 MG capsule    Sig: Take 1-2 capsules (50-100 mg total) by mouth 2 (two) times daily as needed for pain.    Dispense:  60 capsule    Refill:  0   Orders Placed This Encounter  Procedures   Ambulatory referral to Physical Therapy     Return in about 8 weeks (around 05/16/2024).     Selinda JINNY Ku, MD, [facility]   Primary Care Sports Medicine Primary Care and Sports Medicine at [facility]

## 2024-03-21 NOTE — Assessment & Plan Note (Addendum)
 History of Present Illness Kathleen Madden is a 43 year old female who presents with right groin pain following a Pilates session.  Right groin pain - Onset in August during a Pilates session, initially noticed during a 'teaser' move - Focal pain located in the right anterior groin - Pain intensified after using a Pilates chair and during a reformer session with knees in tabletop position - Pain is more pronounced with knees bent in tabletop position and lessens when legs are straightened - Sneezing initially caused excruciating pain, now improved but still requires curling in to minimize discomfort - No radiation of pain down the leg - No associated paresthesia or tingling in the right leg - Unable to participate in Pilates for two months due to pain  Pain management and medication side effects - Intermittent use of heating pad for symptom relief - Ibuprofen 600 mg taken twice daily during pain flares - Ibuprofen provides some relief for hip pain - Significant gastrointestinal side effects from ibuprofen, including stomach discomfort and reflux, limiting its use  Chronic low back pain and paresthesia - Chronic lower back pain with occasional tingling, primarily on the left side - Back pain located in the lower back and side, sometimes radiating to the left leg  PHYSICAL EXAM - RIGHT HIP INSPECTION: Normal alignment without swelling, erythema, ecchymosis, or deformity. No skin changes noted. PALPATION: Non-tender at sacroiliac joint, greater trochanter, gluteus medius distribution, and piriformis. No crepitus or warmth. RANGE OF MOTION: Full and symmetric hip flexion, extension, abduction, and adduction. Internal and external rotation full and painless. STRENGTH: 5/5 strength with resisted hip flexion, abduction, and adduction. Minor anterior groin discomfort elicited with resisted iliopsoas testing. Resisted hip flexion with the knee in 90-degree flexion is benign. SPECIAL TESTS:  Straight leg raise negative. FADIR, FABER, and piriformis tests negative. No reproduction of groin pain with provocative testing. NEUROVASCULAR: Grossly intact.  Assessment and Plan Right hip flexor and/or iliopsoas strain/tendinopathy Likely due to Pilates exercises. Pain focal to right groin, exacerbated by knee bending and pelvis tucking. Differential includes iliopsoas and rectus femoris tendon involvement. History and symptoms suggest strain or tendinopathy. - Prescribed Celebrex 50 mg, 1-2 tablets twice daily as needed with food for one week. Discontinue if gastrointestinal symptoms occur. - Referred to physical therapy for targeted rehabilitation exercises. - Advised follow-up in 8 weeks to assess progress and consider imaging if symptoms persist.  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 - Monitor symptoms and adjust treatment as needed.  Gastroesophageal reflux disease (GERD) GERD symptoms exacerbated by ibuprofen use. Celebrex chosen for lower gastrointestinal side effect profile. - Advise taking Celebrex with food to minimize gastrointestinal side effects. - Discontinue Celebrex if reflux symptoms worsen.

## 2024-03-27 ENCOUNTER — Encounter: Payer: Self-pay | Admitting: Family Medicine

## 2024-03-27 NOTE — Telephone Encounter (Signed)
 Please send referral to another location.  KP

## 2024-04-02 ENCOUNTER — Ambulatory Visit: Admitting: Physical Therapy

## 2024-04-04 ENCOUNTER — Encounter: Admitting: Physical Therapy

## 2024-04-09 ENCOUNTER — Encounter: Admitting: Physical Therapy

## 2024-04-11 ENCOUNTER — Encounter: Admitting: Physical Therapy

## 2024-04-16 ENCOUNTER — Encounter: Admitting: Physical Therapy

## 2024-04-16 NOTE — ED Provider Notes (Signed)
 LWBS/AMA Patient Follow-Up Call  Chart reviewed after patient LWBS after triage. Patient has since been evaluated by another provider, per chart documentation. No further actions needed.

## 2024-04-18 ENCOUNTER — Encounter: Admitting: Physical Therapy

## 2024-04-23 ENCOUNTER — Encounter: Admitting: Physical Therapy

## 2024-04-25 ENCOUNTER — Encounter: Admitting: Physical Therapy

## 2024-04-25 NOTE — Progress Notes (Signed)
 [facility] [address], [address] [place], [place] [address]   Chief complaint: follow up and to discuss episode of lip swelling  Assessment and Plan:  Kathleen Madden is a 43 y.o. woman with allergic rhinitis and anxiety who was seen in follow-up for allergic rhinitis, asthma, and an episode of stomach upset and lip swelling.  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 - Neffy 1 mg nasal spray every 5 minutes PRN anaphylaxis  Possible allergic reaction: This patient's reaction could be consistent with an immediate-type hypersensitivity, although I think this is less likely due to the delayed progression of her symptoms. Her symptoms are more in line with an alpha gal allergy, although the patient did not have exposure to alpha gal (and so this is not a possibility). We discussed that it is most likely that she experienced stomach upset perhaps from a subacute infection and the stress on her body led to an episode of lip swelling. Thankfully, this has not recurred since this episode. I encouraged Kathleen Madden to take Valciclovir and continue eating the foods she had in her dinner the night of her episode (which she has eaten all of these foods again except for the new vegan sausage). If a similar episode occurs again, we discussed how a 2nd generation oral antihistamine is recommended (instead of a 1st generation oral antihistamine, although she may use her hydroxyzine  if she feels it would be helpful for her anxiety) given fewer side effects and good efficacy. - EpiPen is up-to-date; due to needle phobia (see above), prescribe Neffy epinephrine nasal spray per above - If another episode happens, take 10-20 mg cetirizine/Zyrtec instead of Benadryl - Reviewed appropriate indications for use of epinephrine autoinjector - Reviewed appropriate administration  of epinephrine autoinjector  Allergic Rhinitis, seasonal and perennial, poorly controlled: SPT in 12/2022 showed sensitization to dust mite, cat, dog, mold, trees, and weeds. We reviewed appropriate medications and other interventions she may try for her symptoms. I recommended she try Flonase Sensimist given she experiences throat pain with use of regular Flonase. - Recommended performing daily sinus rinses - Recommend trying Flonase Sensimist given side effects with use of regular Flonase - Can increase sinus rinses and Flonase Sensimist use to BID if noticing partial relief - Continue zyrtec prn - She will let us  know if she is interested in AIT  Mild Intermittent Asthma: Well-controlled with PRN rescue inhaler use. 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 - Reviewed indications and techniques to use EpiPen - Refill of EpiPen today; sent in Neffy given needle phobia (see above) but concern that cost may be prohibitive  Return in about 1 year (around 04/25/2025).  I personally spent 35 minutes face-to-face and non-face-to-face in the care of this patient, which includes all pre, intra, and post visit time on the date of service.  All documented time was specific to the E/M visit and does not include any procedures that may have been performed.  Kathleen Percy, MD, PhD Allergy and Immunology   Subjective:  Last clinic visit: 04/21/2023  INTERVAL HISTORY OF PRESENT ILLNESS: Kathleen Madden is a 43 y.o. woman with anxiety who presented for follow-up of allergic rhinitis and history of hazelnut reaction. I independently reviewed available records since the patient's last visit, which are shown below when relevant.  History of Present Illness   Kathleen Madden is a 43 year old female who presents with lip swelling and gastrointestinal symptoms after eating.  [name] ate dinner ~8:45 PM, which was pasta,  vegan butter, some basil, and a new vegan sausage. Shortly after eating, she took valcyclovir for oral cold sores. About 9:30 PM she experienced gastrointestinal symptoms including stomach burning (although this was different from her typical heartburn) and nausea. She went to lie down. She thought she might have diarrhea but never had to go to the bathroom. Before bed, she applied a new chapstick she doesn't use. Later that night, around midnight, she awoke with itchy lips and noticed swelling, which she described as looking like 'baby Botox'. Her face appeared slightly puffy. She took two Benadryl and stayed awake for a while. The symptoms have not recurred since that night.  She mentions a history of stomach burning and discomfort, which she associates with possible GERD, although she has not been formally diagnosed. She describes this episode as different from her usual symptoms, noting it was more intense and did not feel like her typical reflux.  She does not take antihistamines daily but occasionally uses hydroxyzine  for anxiety. She has taken Zyrtec in the past but does not do so regularly. She has an EpiPen available but has not used it before.  She has taken valcyclovir previously without significant issues, although she sometimes experiences dizziness or a 'loopy' feeling. She has not taken it since the episode due to concerns about the stomach burning and nausea that followed its use.       ROS: Pertinent positive and negatives included in the above HPI. All other systems reviewed are negative.  Last visit with me/Allergy: Kathleen Madden is a 43 y.o. woman with anxiety who presented for follow-up of allergic rhinitis and history of hazelnut reaction. I independently reviewed available records (of which none were external records) since the patient's last visit, which are shown below.   Rhinitis: Still having daily sinus pressure/headaches, rhinorrhea, PND. Tried Flonase for a few days  with improvement in symptoms but then stopped using. Did not experience any nasal irritation or nose bleeds with Flonase. Has not yet started using sinus rinses. She is using zyrtec as needed which helps with sinus pressure.   Hazelnut reaction: Avoiding hazelnuts since last visit. She tried fresh pears recently and had similar sensation of burning. Cooked pears were okay. She is interested in doing in office hazelnut challenge. She also has upcoming international travel to the [place] and is worried about accidental exposures. She would like an EpiPen to have available while traveling.   Asthma: history of mild asthma. Uses prn albuterol inhaler and needs an updated prescription. Does not use a spacer.   Interval internal and/or external records:     Cardiology Visit 06/28/2023: Impression and Recommendations:   Palpitations, likely benign and consistent with sinus tachycardia low suspicion for arrhythmia as a cause. She will be traveling however overseas to the [place] for a 2-week vacation with her significant other. I therefore offered for her to utilize metoprolol as needed in case she has an episode of palpitations while on vacation. She will also consider using her Atarax  as well. She appears well compensated euvolemic on exam. She is notified that if she experiences continued symptoms when she returns that she may notify our office for consideration of a cardiac event monitor at that time.  Orders Placed This Encounter  Procedures  EKG 12 lead (ECG1)  Medications Ordered This Encounter  Medications  metoprolol succinate (TOPROL-XL) 25 MG 24 hr tablet  Sig: Take 1 tablet (25 mg total) by mouth daily as needed (palpitations).  Dispense: 30 tablet  Refill: 1   Return if symptoms worsen or fail to improve.  Fonda Halter, MD, RPVI Non-invasive Cardiologist [facility]   History of Present Illness:  Occasional palpitations at nighttime described as fast  heart pounding and heart rates have that is consistent. Lasts a few seconds to minutes at a time. In January 2025 the patient alerted EMS who came and obtain an ECG showing sinus tachycardia she opted not to go to the emergency room. She also endorses a history of anxiety for which she takes Atarax . No significant lower extremity edema orthopnea PND. She feels slightly dehydrated at times. She is also has had some stressors as well. No significant lower extremity edema orthopnea or PND. No syncope.    Past Medical/Surgical/Allergy/Immunization/Social/Family History: Reviewed and updated in Epic since last visit on 04/21/2023.  Current Outpatient Medications  Medication Sig Dispense Refill   albuterol HFA 90 mcg/actuation inhaler Inhale 2 puffs every six (6) hours as needed for wheezing or shortness of breath. 8.5 g 11   cetirizine (ZYRTEC) 5 MG chewable tablet Chew 1 tablet (5 mg total) in the morning.     cholecalciferol, vitamin D3, (CHOLECALCIFEROL, VIT D3,,BULK,) 100,000 unit/gram Powd Take by mouth.     hydrOXYzine  (ATARAX ) 10 MG tablet      inhalational spacing device Spcr 1 each by Miscellaneous route as needed. 1 each 1   valACYclovir (VALTREX) 500 MG tablet Take 1 tablet (500 mg total) by mouth daily.     EPINEPHrine (EPIPEN) 0.3 mg/0.3 mL injection Inject 0.3 mL (0.3 mg total) into the muscle once as needed for anaphylaxis. 2 each 6   EPINEPHrine (NEFFY) 1 mg/spray (0.1 mL) Spry 1 mg into each nostril every five (5) minutes as needed (anaphylaxis). 2 each 3   fluticasone propionate (FLONASE) 50 mcg/actuation nasal spray 1 spray into each nostril daily.     metoPROLOL succinate (TOPROL-XL) 25 MG 24 hr tablet Take 1 tablet (25 mg total) by mouth. (Patient not taking: Reported on 04/25/2024)     multivitamin (TAB-A-VITE/THERAGRAN) per tablet Take 1 tablet by mouth. (Patient not taking: Reported on 04/25/2024)     omeprazole (PRILOSEC OTC) 20 MG tablet Take 1 tablet (20 mg  total) by mouth daily.     polyethylene glycol (MIRALAX) 17 gram packet Take 17 g by mouth daily as needed.     No current facility-administered medications for this visit.   Objective:  PHYSICAL EXAM:  Vitals: BP 146/99 (BP Site: L Arm, BP Position: Sitting, BP Cuff Size: Medium)   Pulse 100   Wt 98.2 kg (216 lb 9.6 oz)   LMP 04/25/2024 (Approximate)   SpO2 96%   BMI 39.62 kg/m  Constitutional: Middle-aged woman sitting comfortably in no acute distress. Pleasant, conversant. Head, Eyes, Ears, Nose, Throat, Neck: Sclera anicteric, conjunctivae non-injected. Moderate allergic shiners. Mask in place. Respiratory: Normal work of breathing on room air. Able to speak in full sentences without difficulty. No cough. Skin: No eczema, hives or swelling. Warm and well perfused without cyanosis, clubbing, or edema. Neurologic: Awake, alert and oriented to person, place, and time. Psychiatric: Normal affect and mood. Thought process linear.  Laboratory testing independently reviewed and interpreted below: TSH Component Ref Range & Units 1 yr ago  TSH 0.450 - 5.330 uIU/mL 2.670  Narrative  Female Patients >12 years only:  Pregnant Females, 1st Trimester: 0.050 - 3.700 IU/mL  Pregnant Females, 2nd Trimester: 0.310 - 4.350 IU/mL  Pregnant Females, 3rd Trimester: 0.410 - 5.180 IU/mL  Specimen Collected: 04/26/23 09:54   Performed by: [facility] [place] [facility](CLIA# [id number]) <redacted file path> Last Resulted: 04/26/23 14:12  Received From: [facility]  Result Received: 01/08/24 12:48    Comprehensive Metabolic Panel Component Ref Range & Units 1 yr ago  Sodium 136 - 145 mmol/L 136  Potassium 3.5 - 5.1 mmol/L 4.4  Comment: NO VISIBLE HEMOLYSIS  Chloride 98 - 107 mmol/L 103  CO2 21 - 31 mmol/L 25  Anion Gap 6 - 14 mmol/L 8  Glucose, Random 70 - 99 mg/dL 79  Blood Urea Nitrogen (BUN) 7 - 25 mg/dL 8  Creatinine 9.39 - 8.79 mg/dL 9.33  eGFR >40  fO/fpw/8.26f7 >90  Comment: GFR estimated by CKD-EPI equations(NKF 2021).  Recommend confirmation of Cr-based eGFR by using Cys-based eGFR and other filtration markers (if applicable) in complex cases and clinical decision-making, as needed.  Albumin 3.5 - 5.7 g/dL 4.4  Total Protein 6.4 - 8.9 g/dL 6.8  Bilirubin, Total 0.3 - 1.0 mg/dL 0.4  Alkaline Phosphatase (ALP) 34 - 104 U/L 48  Aspartate Aminotransferase (AST) 13 - 39 U/L 15  Alanine Aminotransferase (ALT) 7 - 52 U/L 17  Calcium 8.6 - 10.3 mg/dL 9.4  BUN/Creatinine Ratio   Comment: Creatinine is normal, ratio is not clinically indicated.   Specimen Collected: 04/26/23 09:54   Performed by: [facility] [place] [facility](CLIA# [id number]) <redacted file path> Last Resulted: 04/26/23 13:35  Received From: [facility]  Result Received: 01/08/24 12:48    Vitamin D, 25-Hydroxy Component Ref Range & Units 1 yr ago  Vitamin D 25-Hydroxy 30.0 - 100.0 ng/mL 28.7 Low   Comment: Deficient: <20 ng/mL Insufficient: 20-30 ng/mL Sufficient: 30-100 ng/mL Upper Safety Limit/Toxicity: >100 ng/mL   Specimen Collected: 04/26/23 09:54   Performed by: [facility] [place] [facility](CLIA# [id number]) <redacted file path> Last Resulted: 04/26/23 14:12  Received From: [facility]  Result Received: 01/08/24 12:48    CBC without Differential Component Ref Range & Units 1 yr ago  WBC 4.40 - 11.00 10*3/uL 6.80  RBC 4.10 - 5.10 10*6/uL 4.19  Hemoglobin 12.3 - 15.3 g/dL 86.7  Hematocrit 64.0 - 44.6 % 39.9  Mean Corpuscular Volume (MCV) 80.0 - 96.0 fL 95.2  Mean Corpuscular Hemoglobin (MCH) 27.5 - 33.2 pg 31.5  Mean Corpuscular Hemoglobin Conc (MCHC) 33.0 - 37.0 g/dL 66.8  Red Cell Distribution Width (RDW) 12.3 - 17.0 % 13.4  Platelet Count (PLT) 150 - 450 10*3/uL 294  Mean Platelet Volume (MPV) 6.8 - 10.2 fL 9.4   Specimen Collected: 04/26/23 09:54   Performed by: [facility] [place] [facility](CLIA# [id number]) <redacted file path> Last Resulted: 04/26/23 19:02  Received From: [facility]  Result Received: 01/08/24 12:48   Interpretation: Labs reviewed. Labs are WNL except for mildly low vitamin D level.   Imaging independently reviewed and interpreted below: None new since last visit or relevant to today's visit available for personal review.   Testing performed in clinic today: None indicated.

## 2024-04-30 ENCOUNTER — Encounter: Admitting: Physical Therapy

## 2024-05-02 ENCOUNTER — Encounter: Admitting: Physical Therapy

## 2024-05-07 ENCOUNTER — Encounter: Admitting: Physical Therapy

## 2024-05-13 ENCOUNTER — Encounter: Admitting: Physical Therapy

## 2024-05-14 ENCOUNTER — Encounter: Admitting: Physical Therapy

## 2024-05-16 ENCOUNTER — Encounter: Payer: Self-pay | Admitting: General Practice

## 2024-05-16 ENCOUNTER — Encounter: Admitting: Physical Therapy

## 2024-05-16 ENCOUNTER — Ambulatory Visit: Admitting: Family Medicine

## 2024-05-21 ENCOUNTER — Encounter: Admitting: Physical Therapy

## 2024-05-23 ENCOUNTER — Encounter: Admitting: Physical Therapy

## 2024-05-28 NOTE — Progress Notes (Signed)
 " Department of Rehabilitation Services  Physical Therapy Treatment Adult Musculoskeletal  Patient: Kathleen Madden (Preferred name: Kathleen Madden, Kathleen Madden, MRN [id number]   Visit Date: 05/28/2024  Visit Number: 6  for current Episode of Care Clinic Location:  [facility] PT/OT [address] [place] [address] Dept: [phone] Loc: [phone]  Encounter Diagnoses:    ICD-10-CM  1. Pain in right hip  M25.551  2. Other low back pain  M54.59  3. Pain in left hip  M25.552  4. Muscle weakness  M62.81    SUBJECTIVE   Kathleen Madden (Preferred name: Kathleen Madden)was seen in PT today for  .   Subjective Report:  The patients symptoms have  improved.     Pt states she hasn't been sleeping well the last few days so she tried sleeping on the couch and that aggravated her hip a little bit. But overall, she is doing much better.       Pain report:   Pain Assessment Pain Assessment %%: 0-10 Pain Score %%: 0-No pain     Falls:  Has the patient fallen since last visit: no  OBJECTIVE and OUTCOME MEASURES     PROMIS CAT - ADULT      03/28/2024   11:57 04/09/2024   11:37  ADULT PROMIS CAT  PROMIS Physical Function T-Score 47 48  PROMIS Pain Interference T-Score (range: 10 - 90) 54 52  For PROMIS measures, higher scores equals more of the concept being measured.  PROMIS scores have a mean of 50 and standard deviation (SD) of 10. Scores 0.5 - 1.0 SD worse than the mean = mild symptoms/impairment Scores 1.0 - 2.0 SD worse than the mean = moderate symptoms/impairment Scores 2.0 SD or more worse than the mean = severe symptoms/impairment Interpretation of PROMIS Scores WNL Mild Symptoms/Impairment Moderate Symptoms/Impairment Severe Symptoms/Impairment  Physical Function (Adult and Pediatric) 45 or greater 40-44 30-39 29 or less  Adult Pain Interference 54 or less 55-59 60-69 70 or greater  Depression 54 or less 55-59 60-69 70 or greater   Sleep Disturbance 54 or less 55-59 60-69 70 or greater  Pediatric or Parent Proxy Pain Interference 50 or less 50-54 55-64 65 or greater  Cognitive Function  45 or greater 40-44  30-39 29 or less       TREATMENT     Todays treatment included:  Manual therapy: TCJ mobs (L)  --- Not Completed Today ---  MFR pectineus IASTM pectineus Palpated former surgery scar; 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  Therapeutic Procedure (TP): (therapeutic exercises were utilized to improve ROM and strength, enhance function, optimize health and reduce risk through either exercises or cognitive behavioral therapy-informed interventions) HEP education, continuing pilates and other exercising as able and tolerated, prognosis Upright bike seat 4, L4, x5 mins  Ankle MWM x1 min (L) Curtsey lunges off 6 step x10 (B)  Demo'd calf stretch and calf raises PT POC  --- Not Completed Today ---  Hip hike S/l hip ABD x12 no weight, 2x12 w/ 5# -- + hip IR (B) Sidestep BTB at toes in squat position 3x10 steps TT stretch w/ strap 2x30 (B) St hip abd St hip diagonal S/l hip ADD x15 (B) SL calf stretch on step - discussed Review HEP - discussed, updated as detailed below Tall sitting SLR x8 (B) --> updated in HEP  Therapeutic Activity (TA):  (Included education to enhance ability to manage impairments and dysfunction outside of the clinic for ease with ADLs and participation  and normal age related socialization/activities) TRX SL squatting 2x12 (B) - heel elevated during 2nd set for LLE  -- test/re-test for ankle treatment  --- Not Completed Today ---   SL deadlifts at cables, 20# x8, x6 (R), 17# 2x8 (L) --> attempted w/ 20# KB but too unsteady Aura carries Sales Executive on benefits of PFPT to address her adhesions from previous surgery as well as other symptoms she endorsed having w/ pelvic floor  Neuromuscular reeducation (NMR): (performed via the activities in this session to  positively affect movement, balance, coordination, motor control, kinesthetic sense, posture, and/or proprioception) --- Not Completed Today ---  High <> low planks bosu x5 (B) Bosu ball rolls x multiple rounds, alternating CW/CCW circles Inline 1/2 kneeling + OH press bottoms up 8# KB 2x12 (R), 2x8 (L) Side planks + hip abd 2x10 (B) UE Y balance TRX planks + hip abd 2x5 (B) TRX mountain climbers Inline 1/2 kneeling palloff press 13# x10 (B) each direction  1/2 kneeling lifts & chops 10# x10 (B) each TRX plank + shoulder taps Planks on bosu + hip abd Turkish get ups [place] plank 4x5 -> LLE much more difficult  Planks on short box stationary + hip abd 3x5 (B) - much more difficult stabilizing on LLE and moving RLE Qped fire hydrants BTB 2x10 (B) Birddogs x10   HEP: Access Code: [email or web address] URL: [email or web address] Date: 05/28/2024 Prepared by: Lyle Rav  Exercises - Hooklying Hamstring Stretch with Strap  - 1 x daily - 7 x weekly - 1 sets - 3 reps - 30 seconds hold - Supine Sciatic Nerve Glide  - 1 x daily - 7 x weekly - 1 sets - 3 reps - 30 seconds hold - Lateral Lunge Adductor Stretch with Counter Support  - 1 x daily - 7 x weekly - 1 sets - 3 reps - 30 seconds hold - Kneeling Adductor Stretch with Hip External Rotation  - 1 x daily - 7 x weekly - 1 sets - 3 reps - 30 seconds hold - Adductor Stretch on Chair  - 1 x daily - 7 x weekly - 1 sets - 3 reps - 30 seconds hold - Single Leg Bridge  - 1 x daily - 7 x weekly - 3 sets - 10 reps - 3 seconds hold - Gastroc Stretch on Step  - 1 x daily - 7 x weekly - 1 sets - 3 reps - 30 seconds hold - Sidelying Short Adductor Forearm Plank  - 1 x daily - 7 x weekly - 1-3 sets - 5 reps - 5-10 seconds hold - Side Stepping with Resistance at Feet  - 1 x daily - 7 x weekly - 3 sets - 10 reps - Half-Kneel Ankle Dorsiflexion Self-Mobilization on Step  - 1 x daily - 7 x weekly - 1-3 sets - 5-8 reps - 30-60 seconds hold - Gastroc  Stretch on Step  - 1 x daily - 7 x weekly - 1 sets - 3 reps - 30 seconds hold - Standing Single Leg Heel Raise  - 1 x daily - 7 x weekly - 3 sets - 10 reps - 2-3 seconds hold  Objective measures taken today (if any):   EDUCATION  Patient/Family education: The patient received education regarding Home Exercises by verbal communication, demonstration, and handout, appeared to show willingness to learn and Return demonstration with independency  ASSESSMENT  Continued w/ progressed SL functional strength, stability, and core strength/stability work. Min cues for form t/o session.  Addressed L ankle hypomobility today, as this limits her squats in clinic and t/o her pilates. Improved SL squats today after MWM and manual TCJ mobs. L calf also weaker than R, as it was more difficult to perform SL calf raises on L compared to R. We added these exercises to HEP and pt will f/u in 1 month, if needed. She is starting PFPT in January to address scar tissue and PF strength deficits from her myomectomy 2YA.    Goals: 05/28/24  Patient will decrease average hip pain level to no more than 1-2/10 at least 75% of the time in order to increase tolerance to activity. - met The patient will demonstrate improved strength of core, gluteals and hip abductors of greater or equal to 4/5 for functional control during dynamic activities and transfers. - met via gross assessment The patient will demonstrate improved ROM as displayed by pain-free ROM within functional limits to allow for normalized gait and functional mobility. - met The patient will report improved function as measured by a Lower Extremity Functional Scale score of > or = to 70/80, function as measured by an increase in PROMIS-CAT Physical Function score by > or = to 5 points, and engagement in activity as measured by a decrease in PROMIS-CAT Pain Interference score by > or = to 5 points. - met The patient will be independent with HEP. - met  Time Frame:  12  weeks              Rehab Potential - Good  PLAN  The following plan for future treatment was agreed upon by the patient and/or family.    Plan: Continue at a frequency of 1x/month  Current POC (if applicable) through:   Last Progress note/Goal Update: Yes (05/28/2024 12:01 AM)   Plan for next Visit: manual PRN, progress hip, core, functional strength as able and tolerated to maximize full return to pilates  Billing Information: Visit Date: 05/28/2024 PT Evaluation or progress note completed today: Yes Date of Onset: 01/27/24 Visit Number: 6 Session Start:: 0940 Session Stop:: 1018 Total Time: 38 minutes        Therapeutic Activity CPT 97530: 13 minutes Therapeutic Procedure CPT 97110 : 20 minutes Manual Therapy CPT 97140: 5 minutes                    Note: Per the evaluating physical therapist's plan of care, if patient does not return for follow up visit(s) related to this episode of care, this note will serve as their discharge note from physical therapy.  If patient returns to clinic with variance in plan of care, then it may be attributable to one or more of the following factors: preferred clinician availability, appointment time request availability, therapy pool appointment availability, major holiday with clinic closure, care partner availability, patient transportation, conflicting medical appointment, inclement weather, patient illness, and/or scheduling error.  ___________________________________________________                                 Clinical Notes on MyChart: Progress notes documented by your healthcare team will now be available on the MyChart portal.  We believe that patients should be a part of the healthcare team.  We encourage you to review notes after visits and in preparation for upcoming appointments.  This provides the opportunity to review recommendations as well as to prepare questions for your healthcare team to address during your next  visit.   If you identify discrepancies in the documentation or have specific questions related to the notes, please bring them to your next scheduled visit to discuss with your Physical Therapist (PT).  With increased transparency, our hope is that we create more trust, better communication, more shared decision-making, and increased satisfaction. Please be aware that these notes will not be discussed over the phone or through My Chart messages.  They will be discussed only at your next office visit with your provider. "

## 2024-05-30 NOTE — Telephone Encounter (Signed)
"  Rx sent  "

## 2024-06-19 ENCOUNTER — Encounter: Payer: Self-pay | Admitting: Student

## 2024-06-19 ENCOUNTER — Ambulatory Visit: Admitting: Student

## 2024-06-19 VITALS — BP 116/78 | HR 97 | Ht 62.0 in | Wt 219.0 lb

## 2024-06-19 DIAGNOSIS — K219 Gastro-esophageal reflux disease without esophagitis: Secondary | ICD-10-CM | POA: Diagnosis not present

## 2024-06-19 DIAGNOSIS — J4520 Mild intermittent asthma, uncomplicated: Secondary | ICD-10-CM

## 2024-06-19 DIAGNOSIS — R400 Somnolence: Secondary | ICD-10-CM | POA: Diagnosis not present

## 2024-06-19 DIAGNOSIS — B002 Herpesviral gingivostomatitis and pharyngotonsillitis: Secondary | ICD-10-CM

## 2024-06-19 DIAGNOSIS — F411 Generalized anxiety disorder: Secondary | ICD-10-CM | POA: Diagnosis not present

## 2024-06-19 DIAGNOSIS — J45909 Unspecified asthma, uncomplicated: Secondary | ICD-10-CM | POA: Insufficient documentation

## 2024-06-19 DIAGNOSIS — R002 Palpitations: Secondary | ICD-10-CM

## 2024-06-19 DIAGNOSIS — E559 Vitamin D deficiency, unspecified: Secondary | ICD-10-CM

## 2024-06-19 MED ORDER — SERTRALINE HCL 25 MG PO TABS: 25.0000 mg | ORAL_TABLET | Freq: Every day | ORAL | 3 refills | Status: AC

## 2024-06-19 MED ORDER — HYDROXYZINE HCL 25 MG PO TABS: 25.0000 mg | ORAL_TABLET | Freq: Three times a day (TID) | ORAL | 1 refills | Status: AC | PRN

## 2024-06-19 NOTE — Progress Notes (Signed)
 "  New Patient Office Visit  Subjective    Patient ID: Kathleen Madden, female    DOB: Sep 13, 1980  Age: 44 y.o. MRN: [id number]  CC:  Chief Complaint  Patient presents with   Establish Care   Anxiety   Insomnia    HPI Kathleen Madden is a 44 year old person living with anxiety and palpitations presents to establish care.  Reports at home HR ranges 115-118 with activity  Takes hydroxyzine  for anxiety. Using about 2 tablets daily. Does help with palpitations.  Reports significant anxiety related to health associated with thoughts of impending irritability and sleep disturbances.  Also reports depressed mood and anhedonia  worse in the winter months. Not on medication for anxiety. Has tried to reduce wine intake.  Denies  SI, HI, AVD,   Outpatient Encounter Medications as of 06/19/2024  Medication Sig   Albuterol Sulfate 108 (90 Base) MCG/ACT AEPB Inhale into the lungs. (Patient taking differently: Inhale into the lungs as needed.)   EPINEPHrine 0.3 mg/0.3 mL IJ SOAJ injection Inject 0.3 mg into the muscle.   famotidine (PEPCID) 20 MG tablet Take 20 mg by mouth as needed for indigestion or heartburn.   fluticasone (FLONASE) 50 MCG/ACT nasal spray Place 1 spray into both nostrils daily.   ketoconazole (NIZORAL) 2 % shampoo PLEASE SEE ATTACHED FOR DETAILED DIRECTIONS   metoprolol succinate (TOPROL-XL) 25 MG 24 hr tablet Take 25 mg by mouth daily as needed. (Patient taking differently: Take 12.5 mg by mouth daily as needed.)   Multiple Vitamin (MULTIVITAMIN) tablet Take 1 tablet by mouth daily.   NEFFY 1 MG/0.1ML SOLN Place 1 mg into the nose.   polyethylene glycol (MIRALAX / GLYCOLAX) 17 g packet Take 17 g by mouth daily as needed.   sertraline  (ZOLOFT ) 25 MG tablet Take 1 tablet (25 mg total) by mouth daily.   valACYclovir (VALTREX) 500 MG tablet Take 500 mg by mouth daily.   [DISCONTINUED] hydrOXYzine  (ATARAX ) 10 MG tablet Take 10 mg by mouth at bedtime.   hydrOXYzine  (ATARAX ) 25  MG tablet Take 1 tablet (25 mg total) by mouth every 8 (eight) hours as needed.   [DISCONTINUED] celecoxib  (CELEBREX ) 50 MG capsule Take 1-2 capsules (50-100 mg total) by mouth 2 (two) times daily as needed for pain. (Patient not taking: Reported on 06/19/2024)   No facility-administered encounter medications on file as of 06/19/2024.    Past Medical History:  Diagnosis Date   Palpitations     Past Surgical History:  Procedure Laterality Date   MOUTH SURGERY     WISDOM TOOTH EXTRACTION      Family History  Problem Relation Age of Onset   Hypothyroidism Mother    Healthy Father    Healthy Brother    Healthy Maternal Grandmother    Heart Problems Maternal Grandfather     Social History   Socioeconomic History   Marital status: Unknown    Spouse name: Not on file   Number of children: Not on file   Years of education: Not on file   Highest education level: Not on file  Occupational History   Not on file  Tobacco Use   Smoking status: Never   Smokeless tobacco: Never  Vaping Use   Vaping status: Never Used  Substance and Sexual Activity   Alcohol use: Yes    Comment: once a week   Drug use: No   Sexual activity: Yes    Partners: Male  Other Topics Concern   Not  on file  Social History Narrative   Not on file   Social Drivers of Health   Tobacco Use: Low Risk (06/19/2024)   Patient History    Smoking Tobacco Use: Never    Smokeless Tobacco Use: Never    Passive Exposure: Not on file  Recent Concern: Tobacco Use - Medium Risk (04/25/2024)   Received from [facility]   Patient History    Smoking Tobacco Use: Former    Smokeless Tobacco Use: Never    Passive Exposure: Not on file  Financial Resource Strain: Not on file  Food Insecurity: No Food Insecurity (06/19/2024)   Epic    Worried About Programme Researcher, Broadcasting/film/video in the Last Year: Never true    Ran Out of Food in the Last Year: Never true  Transportation Needs: No Transportation Needs (06/19/2024)   Epic     Lack of Transportation (Medical): No    Lack of Transportation (Non-Medical): No  Physical Activity: Not on file  Stress: Not on file  Social Connections: Not on file  Intimate Partner Violence: Not At Risk (06/19/2024)   Epic    Fear of Current or Ex-Partner: No    Emotionally Abused: No    Physically Abused: No    Sexually Abused: No  Depression (PHQ2-9): High Risk (06/19/2024)   Depression (PHQ2-9)    PHQ-2 Score: 15  Alcohol Screen: Not on file  Housing: Unknown (06/19/2024)   Epic    Unable to Pay for Housing in the Last Year: No    Number of Times Moved in the Last Year: Not on file    Homeless in the Last Year: No  Utilities: Not At Risk (06/19/2024)   Epic    Threatened with loss of utilities: No  Health Literacy: Not on file    ROS Refer to HPI    Objective   BP 116/78   Pulse 97   Ht 5' 2 (1.575 m)   Wt 219 lb (99.3 kg)   LMP 05/29/2024   SpO2 97%   BMI 40.06 kg/m   Physical Exam Constitutional:      Appearance: Normal appearance.  HENT:     Mouth/Throat:     Mouth: Mucous membranes are moist.     Pharynx: Oropharynx is clear.  Neck:     Comments: No masses Cardiovascular:     Rate and Rhythm: Normal rate and regular rhythm.     Heart sounds: No murmur heard. Pulmonary:     Effort: Pulmonary effort is normal.     Breath sounds: No rhonchi or rales.  Abdominal:     General: Abdomen is flat. Bowel sounds are normal. There is no distension.     Palpations: Abdomen is soft.     Tenderness: There is no abdominal tenderness.  Musculoskeletal:        General: Normal range of motion.     Right lower leg: No edema.     Left lower leg: No edema.  Skin:    General: Skin is warm and dry.     Capillary Refill: Capillary refill takes less than 2 seconds.  Neurological:     General: No focal deficit present.     Mental Status: She is alert and oriented to person, place, and time.  Psychiatric:        Mood and Affect: Mood normal.        Behavior: Behavior  normal.       06/19/2024    3:24 PM  Results  of the Epworth flowsheet  Sitting and reading 2  Watching TV 1  Sitting, inactive in a public place (e.g. a theatre or a meeting) 1  As a passenger in a car for an hour without a break 1  Lying down to rest in the afternoon when circumstances permit 3  Sitting and talking to someone 0  Sitting quietly after a lunch without alcohol 3  In a car, while stopped for a few minutes in traffic 0  Total score 11       06/19/2024    2:46 PM 03/21/2024    9:12 AM  Depression screen PHQ 2/9  Decreased Interest 2 0  Down, Depressed, Hopeless 1 0  PHQ - 2 Score 3 0  Altered sleeping 3 0  Tired, decreased energy 3 0  Change in appetite 0 0  Feeling bad or failure about yourself  1 0  Trouble concentrating 2 2  Moving slowly or fidgety/restless 3 2  Suicidal thoughts 0 0  PHQ-9 Score 15 4   Difficult doing work/chores Somewhat difficult Not difficult at all     Data saved with a previous flowsheet row definition      06/19/2024    2:47 PM 03/21/2024    9:13 AM  GAD 7 : Generalized Anxiety Score  Nervous, Anxious, on Edge 3 2  Control/stop worrying 3 0  Worry too much - different things 3 2  Trouble relaxing 3 0  Restless 3 2  Easily annoyed or irritable 3 1  Afraid - awful might happen 3 1  Total GAD 7 Score 21 8  Anxiety Difficulty Very difficult Not difficult at all    Last CBC Lab Results  Component Value Date   WBC 6.1 06/19/2024   HGB 14.0 06/19/2024   HCT 41.9 06/19/2024   MCV 96 06/19/2024   MCH 32.0 06/19/2024   RDW 12.4 06/19/2024   PLT 340 06/19/2024   Last metabolic panel Lab Results  Component Value Date   GLUCOSE 119 (H) 10/25/2019   NA 137 10/25/2019   K 4.2 10/25/2019   CL 106 10/25/2019   CO2 24 10/25/2019   BUN 14 10/25/2019   CREATININE 0.62 10/25/2019   GFRNONAA >60 10/25/2019   CALCIUM 9.3 10/25/2019   PROT 7.4 10/25/2019   ALBUMIN 4.4 10/25/2019   BILITOT 0.6 10/25/2019   ALKPHOS 40 10/25/2019    AST 15 10/25/2019   ALT 12 10/25/2019   ANIONGAP 7 10/25/2019   Last thyroid functions Lab Results  Component Value Date   TSH 3.140 06/19/2024        Assessment & Plan:  Palpitation Assessment & Plan: Palpitations have been an ongoing issue for her for the past year or so she did see cardiology on 06/28/2023 for palpitations EKG previously showed sinus tachycardia.  Suspected to have benign tachycardia with low suspicion for arrhythmia given normal EKG by cardiology.  She was given metoprolol to use as needed.  She has used this 3-4 times which has helped with her symptoms.  Typically symptoms occur at nighttime.  She also takes hydroxyzine  for anxiety which also improves her symptoms.  Discussed cardiac monitoring to help further assess this she declines at this time but will let me know if she changes her mind.  Will check TSH today as she is concerned for thyroid disease.  Due metoprolol 25 mg as needed.  Orders: -     TSH -     CBC with Differential/Platelet  Gastroesophageal reflux disease  without esophagitis Assessment & Plan: Currently on pepcid 20 mg daily, no red flag symptoms.  Continue Pepcid.   Mild intermittent asthma without complication Assessment & Plan: Managed by allergy.  Using albuterol as needed.   Oral herpes Assessment & Plan: Has flares about once a month and uses valacyvvir of the gums, discussed suppresivee therapy, she would like to think about it.  Will revisit this at a future time.   GAD (generalized anxiety disorder) Assessment & Plan: Very anxious about her health.  She is currently taking hydroxyzine  25 mg as needed which is helpful for sleep.  Will start on Zoloft  given continued symptoms of anxiety.  Continue hydroxyzine  as needed.  Follow-up in about 1 month.   Daytime sleepiness Assessment & Plan: Reports daytime sleepiness and does occasionally wake up gasping for air.  She has mildly elevated Epworth of 11 she is concerned for sleep  apnea in the setting of her palpitations.  Will refer to sleep medicine.  Orders: -     Pulmonary Visit  Vitamin D  deficiency Assessment & Plan: Vitamin D  on 04/26/2023 of 28.7.  Not currently on supplementation will check vitamin D  today.  Orders: -     VITAMIN D  25 Hydroxy (Vit-D Deficiency, Fractures)  Other orders -     Sertraline  HCl; Take 1 tablet (25 mg total) by mouth daily.  Dispense: 30 tablet; Refill: 3 -     hydrOXYzine  HCl; Take 1 tablet (25 mg total) by mouth every 8 (eight) hours as needed.  Dispense: 60 tablet; Refill: 1    Return in about 4 weeks (around 07/17/2024) for mood.   Harlene Saddler, MD "

## 2024-06-19 NOTE — Assessment & Plan Note (Addendum)
 Has flares about once a month and uses valacyvvir of the gums, discussed suppresivee therapy, she would like to think about it.  Will revisit this at a future time.

## 2024-06-19 NOTE — Assessment & Plan Note (Addendum)
 Very anxious about her health.  She is currently taking hydroxyzine  25 mg as needed which is helpful for sleep.  Will start on Zoloft  given continued symptoms of anxiety.  Continue hydroxyzine  as needed.  Follow-up in about 1 month.

## 2024-06-19 NOTE — Patient Instructions (Addendum)
 Please start zoloft  25 mg daily for anxiety I have increase hydroxyzine  to 25 mg daily which you can take as needed  Please check psychology today to looking to counseling   I have made a referral to the sleep clinic to evaluate for sleep, please let me know if you do not hear back about scheduling an appointment   Please try [employer] app for insomnia   I will message with lab results   Follow up in 1 month

## 2024-06-19 NOTE — Assessment & Plan Note (Addendum)
 Palpitations have been an ongoing issue for her for the past year or so she did see cardiology on 06/28/2023 for palpitations EKG previously showed sinus tachycardia.  Suspected to have benign tachycardia with low suspicion for arrhythmia given normal EKG by cardiology.  She was given metoprolol to use as needed.  She has used this 3-4 times which has helped with her symptoms.  Typically symptoms occur at nighttime.  She also takes hydroxyzine  for anxiety which also improves her symptoms.  Discussed cardiac monitoring to help further assess this she declines at this time but will let me know if she changes her mind.  Will check TSH today as she is concerned for thyroid disease.  Due metoprolol 25 mg as needed.

## 2024-06-19 NOTE — Assessment & Plan Note (Addendum)
 Currently on pepcid 20 mg daily, no red flag symptoms.  Continue Pepcid.

## 2024-06-20 ENCOUNTER — Ambulatory Visit: Payer: Self-pay | Admitting: Student

## 2024-06-20 LAB — CBC WITH DIFFERENTIAL/PLATELET
Basophils Absolute: 0.1 x10E3/uL (ref 0.0–0.2)
Basos: 1 %
EOS (ABSOLUTE): 0.2 x10E3/uL (ref 0.0–0.4)
Eos: 3 %
Hematocrit: 41.9 % (ref 34.0–46.6)
Hemoglobin: 14 g/dL (ref 11.1–15.9)
Immature Grans (Abs): 0 x10E3/uL (ref 0.0–0.1)
Immature Granulocytes: 0 %
Lymphocytes Absolute: 1.9 x10E3/uL (ref 0.7–3.1)
Lymphs: 31 %
MCH: 32 pg (ref 26.6–33.0)
MCHC: 33.4 g/dL (ref 31.5–35.7)
MCV: 96 fL (ref 79–97)
Monocytes Absolute: 0.5 x10E3/uL (ref 0.1–0.9)
Monocytes: 7 %
Neutrophils Absolute: 3.5 x10E3/uL (ref 1.4–7.0)
Neutrophils: 58 %
Platelets: 340 x10E3/uL (ref 150–450)
RBC: 4.37 x10E6/uL (ref 3.77–5.28)
RDW: 12.4 % (ref 11.7–15.4)
WBC: 6.1 x10E3/uL (ref 3.4–10.8)

## 2024-06-20 LAB — TSH: TSH: 3.14 u[IU]/mL (ref 0.450–4.500)

## 2024-06-20 LAB — VITAMIN D 25 HYDROXY (VIT D DEFICIENCY, FRACTURES): Vit D, 25-Hydroxy: 24.9 ng/mL — ABNORMAL LOW (ref 30.0–100.0)

## 2024-06-24 DIAGNOSIS — R400 Somnolence: Secondary | ICD-10-CM | POA: Insufficient documentation

## 2024-06-24 NOTE — Assessment & Plan Note (Signed)
 Reports daytime sleepiness and does occasionally wake up gasping for air.  She has mildly elevated Epworth of 11 she is concerned for sleep apnea in the setting of her palpitations.  Will refer to sleep medicine.

## 2024-06-24 NOTE — Assessment & Plan Note (Signed)
 Vitamin D  on 04/26/2023 of 28.7.  Not currently on supplementation will check vitamin D  today.

## 2024-06-24 NOTE — Assessment & Plan Note (Signed)
 Managed by allergy.  Using albuterol as needed.

## 2024-07-05 ENCOUNTER — Encounter: Payer: Self-pay | Admitting: Sleep Medicine

## 2024-07-05 ENCOUNTER — Ambulatory Visit: Admitting: Sleep Medicine

## 2024-07-05 VITALS — BP 120/80 | HR 94 | Temp 98.4°F | Ht 62.0 in | Wt 219.4 lb

## 2024-07-05 DIAGNOSIS — G4733 Obstructive sleep apnea (adult) (pediatric): Secondary | ICD-10-CM

## 2024-07-05 DIAGNOSIS — R0683 Snoring: Secondary | ICD-10-CM

## 2024-07-05 DIAGNOSIS — G4710 Hypersomnia, unspecified: Secondary | ICD-10-CM | POA: Diagnosis not present

## 2024-07-05 DIAGNOSIS — Z6841 Body Mass Index (BMI) 40.0 and over, adult: Secondary | ICD-10-CM | POA: Diagnosis not present

## 2024-07-05 NOTE — Patient Instructions (Signed)
 SABRA

## 2024-07-05 NOTE — Progress Notes (Signed)
 "      Name:Kathleen Madden MRN: [id number] DOB: 04/21/1981   CHIEF COMPLAINT:  EXCESSIVE DAYTIME SLEEPINESS   HISTORY OF PRESENT ILLNESS: Kathleen Madden is a 44 y.o. w/ a h/o anxiety, GERD and morbid obesity who presents for c/o intermittent snoring and excessive daytime sleepiness which has been present for several years. Reports nocturnal awakenings due to occasional palpitations, however does not have difficulty falling back to sleep. Denies any significant weight changes. Admits to dry mouth, night sweats and morning headaches. Denies RLS symptoms, dream enactment, cataplexy, hypnagogic or hypnapompic hallucinations. Denies a family history of sleep apnea. Denies drowsy driving. Drinks 1 cup of coffee daily, denies alcohol, tobacco or illicit drug use.   Bedtime 10-11 pm Sleep onset 30 mins Rise time 8-9 am   EPWORTH SLEEP SCORE 5    07/05/2024    9:00 AM 06/19/2024    3:24 PM  Results of the Epworth flowsheet  Sitting and reading 1 2  Watching TV 1 1  Sitting, inactive in a public place (e.g. a theatre or a meeting) 0 1  As a passenger in a car for an hour without a break 0 1  Lying down to rest in the afternoon when circumstances permit 3 3  Sitting and talking to someone 0 0  Sitting quietly after a lunch without alcohol 0 3  In a car, while stopped for a few minutes in traffic 0 0  Total score 5 11    PAST MEDICAL HISTORY :   has a past medical history of Palpitations.  has a past surgical history that includes Mouth surgery and Wisdom tooth extraction. Prior to Admission medications  Medication Sig Start Date End Date Taking? Authorizing Provider  Albuterol Sulfate 108 (90 Base) MCG/ACT AEPB Inhale into the lungs. Patient taking differently: Inhale into the lungs as needed.   Yes [provider]  EPINEPHrine 0.3 mg/0.3 mL IJ SOAJ injection Inject 0.3 mg into the muscle. 04/25/24  Yes [provider]  famotidine (PEPCID) 20 MG tablet Take 20 mg by  mouth as needed for indigestion or heartburn.   Yes [provider]  fluticasone (FLONASE) 50 MCG/ACT nasal spray Place 1 spray into both nostrils daily.   Yes [provider]  hydrOXYzine  (ATARAX ) 25 MG tablet Take 1 tablet (25 mg total) by mouth every 8 (eight) hours as needed. 06/19/24 08/18/24 Yes Lemon Raisin, MD  ketoconazole (NIZORAL) 2 % shampoo PLEASE SEE ATTACHED FOR DETAILED DIRECTIONS 04/25/24  Yes [provider]  metoprolol succinate (TOPROL-XL) 25 MG 24 hr tablet Take 25 mg by mouth daily as needed. Patient taking differently: Take 12.5 mg by mouth daily as needed.   Yes [provider]  Multiple Vitamin (MULTIVITAMIN) tablet Take 1 tablet by mouth daily.   Yes [provider]  NEFFY 1 MG/0.1ML SOLN Place 1 mg into the nose. 04/25/24  Yes [provider]  polyethylene glycol (MIRALAX / GLYCOLAX) 17 g packet Take 17 g by mouth daily as needed.   Yes [provider]  sertraline  (ZOLOFT ) 25 MG tablet Take 1 tablet (25 mg total) by mouth daily. 06/19/24  Yes Lemon Raisin, MD  valACYclovir (VALTREX) 500 MG tablet Take 500 mg by mouth daily. 07/22/23  Yes [provider]   Allergies[1]  FAMILY HISTORY:  family history includes Healthy in her brother, father, and maternal grandmother; Heart Problems in her maternal grandfather; Hypothyroidism in her mother. SOCIAL HISTORY:  reports that she has never smoked. She has  never used smokeless tobacco. She reports current alcohol use. She reports that she does not use drugs.   Review of Systems:  Gen:  Denies  fever, sweats, chills weight loss  HEENT: Denies blurred vision, double vision, ear pain, eye pain, hearing loss, nose bleeds, sore throat Cardiac:  No dizziness, chest pain or heaviness, chest tightness,edema, No JVD Resp:   No cough, -sputum production, -shortness of breath,-wheezing, -hemoptysis,  Gi: Denies swallowing difficulty, stomach pain, nausea or vomiting,  diarrhea, constipation, bowel incontinence Gu:  Denies bladder incontinence, burning urine Ext:   Denies Joint pain, stiffness or swelling Skin: Denies  skin rash, easy bruising or bleeding or hives Endoc:  Denies polyuria, polydipsia , polyphagia or weight change Psych:   Denies depression, insomnia or hallucinations  Other:  All other systems negative  VITAL SIGNS: BP 120/80   Pulse 94   Temp 98.4 F (36.9 C)   Ht 5' 2 (1.575 m)   Wt 219 lb 6.4 oz (99.5 kg)   LMP 05/29/2024   SpO2 97%   BMI 40.13 kg/m    Physical Examination:   General Appearance: No distress  EYES PERRLA, EOM intact.   NECK Supple, No JVD Pulmonary: normal breath sounds, No wheezing.  CardiovascularNormal S1,S2.  No m/r/g.   Abdomen: Benign, Soft, non-tender. Skin:   warm, no rashes, no ecchymosis  Extremities: normal, no cyanosis, clubbing. Neuro:without focal findings,  speech normal  PSYCHIATRIC: Mood, affect within normal limits.   ASSESSMENT AND PLAN  OSA I suspect that OSA is likely present due to clinical presentation. Discussed the consequences of untreated sleep apnea. Advised not to drive drowsy for safety of patient and others. Will complete further evaluation with a home sleep study and follow up to review results.    Morbid obesity Counseled patient on diet and lifestyle modification.    MEDICATION ADJUSTMENTS/LABS AND TESTS ORDERED: Recommend Sleep Study   Patient  satisfied with Plan of action and management. All questions answered  Follow up to review HST results and treatment plan.   I spent a total of 31 minutes reviewing chart data, face-to-face evaluation with the patient, counseling and coordination of care as detailed above.    Eythan Jayne, M.D.  Sleep Medicine [facility]           [1]  Allergies Allergen Reactions   Amoxicillin Hives   Corylus Other (See Comments)   Penicillins Dermatitis and Hives   "

## 2024-07-22 ENCOUNTER — Ambulatory Visit: Admitting: Student

## 2024-07-22 ENCOUNTER — Ambulatory Visit: Admitting: Family Medicine

## 2024-10-04 ENCOUNTER — Ambulatory Visit: Admitting: Sleep Medicine
